# Patient Record
Sex: Female | Born: 1965 | Race: White | Hispanic: No | Marital: Married | Smoking: Never smoker
Health system: Southern US, Academic
[De-identification: ages and names within clinical notes are randomized; demographics above are authoritative.]

## PROBLEM LIST (undated history)

## (undated) DIAGNOSIS — T8859XA Other complications of anesthesia, initial encounter: Secondary | ICD-10-CM

## (undated) DIAGNOSIS — L940 Localized scleroderma [morphea]: Secondary | ICD-10-CM

## (undated) DIAGNOSIS — K449 Diaphragmatic hernia without obstruction or gangrene: Secondary | ICD-10-CM

## (undated) DIAGNOSIS — E785 Hyperlipidemia, unspecified: Secondary | ICD-10-CM

## (undated) DIAGNOSIS — I10 Essential (primary) hypertension: Secondary | ICD-10-CM

## (undated) DIAGNOSIS — E039 Hypothyroidism, unspecified: Secondary | ICD-10-CM

## (undated) DIAGNOSIS — M069 Rheumatoid arthritis, unspecified: Secondary | ICD-10-CM

## (undated) DIAGNOSIS — M329 Systemic lupus erythematosus, unspecified: Secondary | ICD-10-CM

## (undated) DIAGNOSIS — Z8679 Personal history of other diseases of the circulatory system: Secondary | ICD-10-CM

## (undated) HISTORY — DX: Hyperlipidemia, unspecified: E78.5

## (undated) HISTORY — DX: Other complications of anesthesia, initial encounter: T88.59XA

## (undated) HISTORY — DX: Personal history of other diseases of the circulatory system: Z86.79

## (undated) HISTORY — PX: HX APPENDECTOMY: SHX54

## (undated) HISTORY — PX: HX COLONOSCOPY: 2100001147

## (undated) HISTORY — PX: LAPAROSCOPIC CHOLECYSTECTOMY: SUR755

## (undated) HISTORY — PX: HX TONSILLECTOMY: SHX27

## (undated) HISTORY — DX: Hypothyroidism, unspecified: E03.9

## (undated) HISTORY — DX: Localized scleroderma (morphea): L94.0

## (undated) HISTORY — DX: Rheumatoid arthritis, unspecified: M06.9

## (undated) HISTORY — DX: Diaphragmatic hernia without obstruction or gangrene: K44.9

## (undated) HISTORY — DX: Systemic lupus erythematosus, unspecified: M32.9

## (undated) HISTORY — DX: Essential (primary) hypertension: I10

---

## 1993-02-09 ENCOUNTER — Other Ambulatory Visit (HOSPITAL_COMMUNITY): Payer: Self-pay

## 2018-11-29 IMAGING — CT CT HEAD/BRAIN W/O & W/ DYE
3 series · 17 of 30 positions shown, 19 images · non-contrast
Comparison: None available.

EXAM:  CT HEAD/BRAIN W/O & W/ DYE
INDICATION: Dizziness.
TECHNIQUE: Axial CT imaging was performed from skull base to the vertex without and with 75 mL of Optiray 300. Exam was performed using 1 or more of the following dose reduction techniques: Automated exposure control, adjustment of the mA and/or kV according to patient size, or the use of iterative reconstruction technique.

[Series 6: ax (hospital) · axial · 0.49mm/px · z∈[+1419,+1539]mm · 8 of 104 slices shown, 10 images]
[im 12/104  brain]
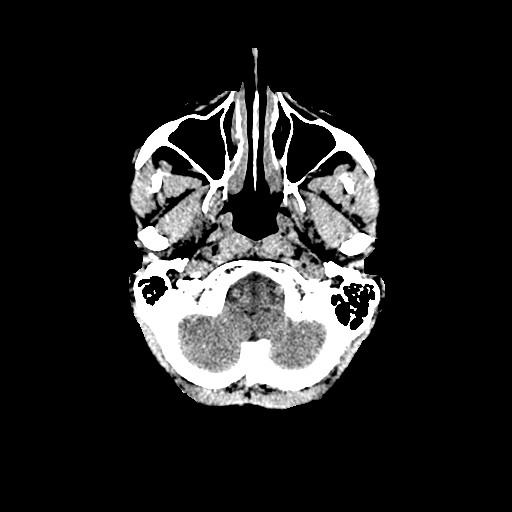
[im 12/104  bone]
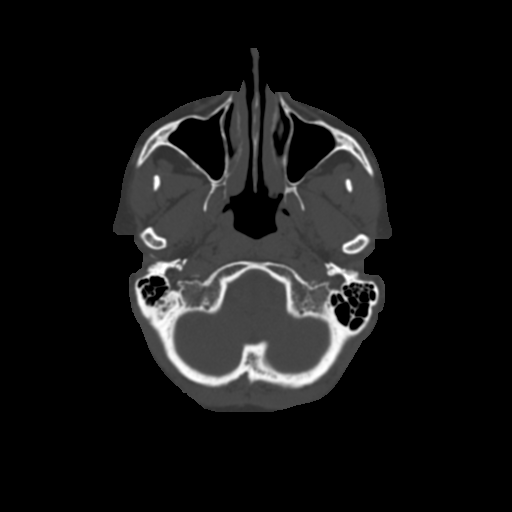
[im 23/104  brain]
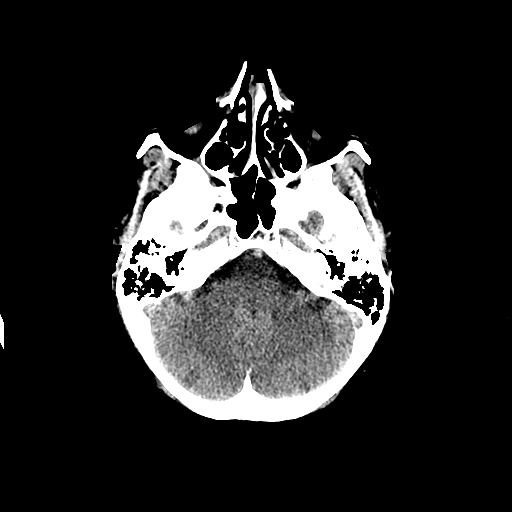
[im 35/104  brain]
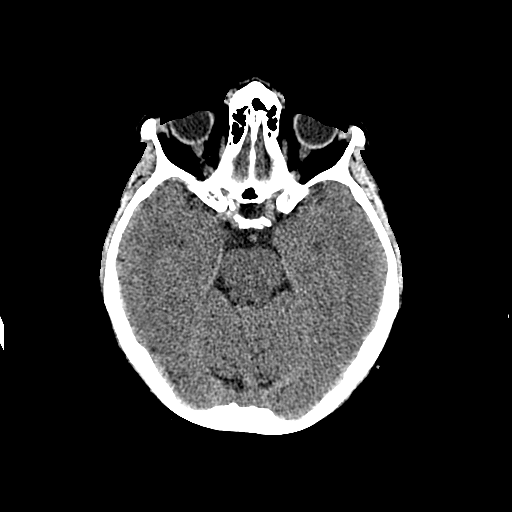
[im 46/104  brain]
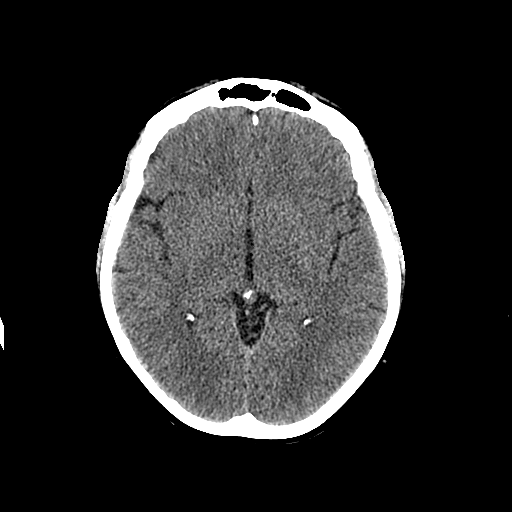
[im 58/104  brain]
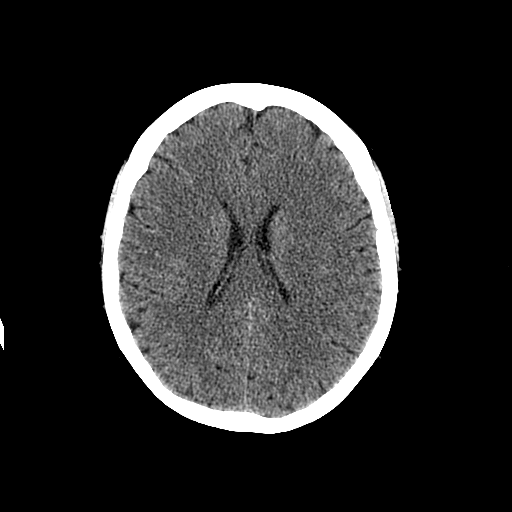
[im 58/104  bone]
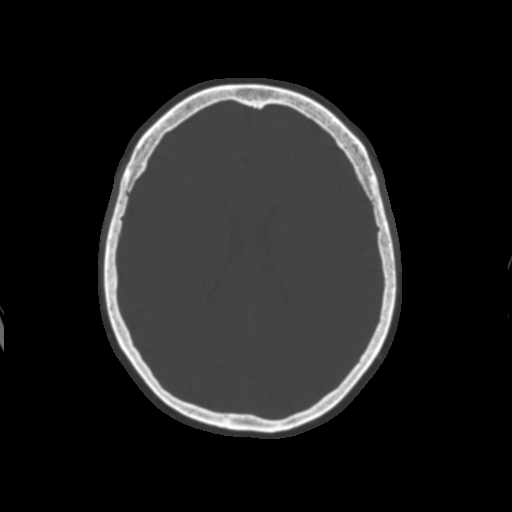
[im 69/104  brain]
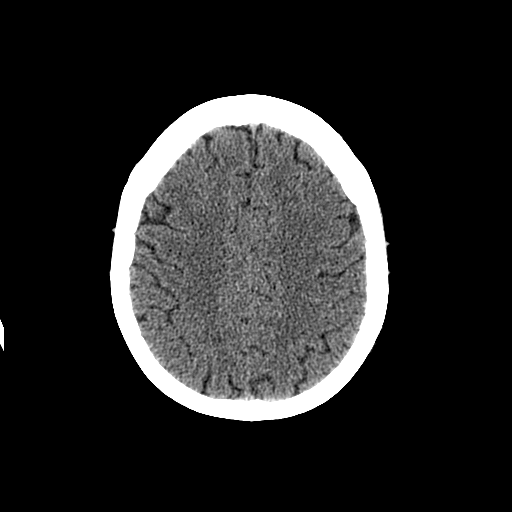
[im 81/104  brain]
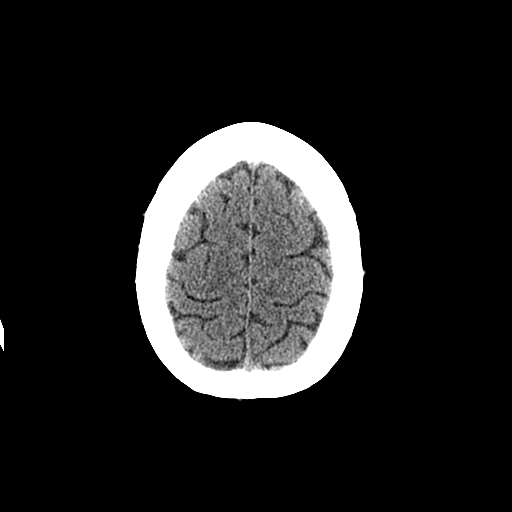
[im 92/104  brain]
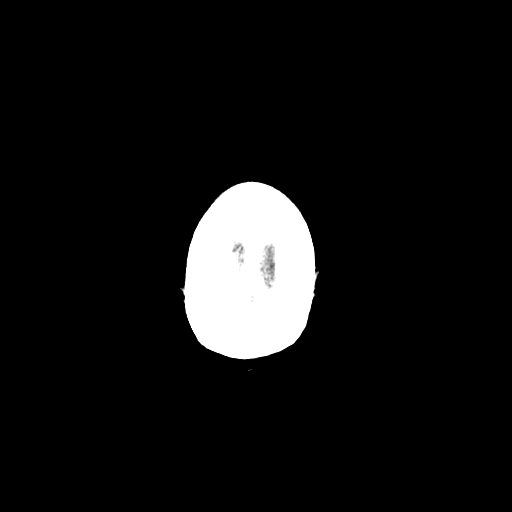

[bone wc · axial · 0.49mm/px · 1 of 104 slices shown]
[im 12/104  bone]
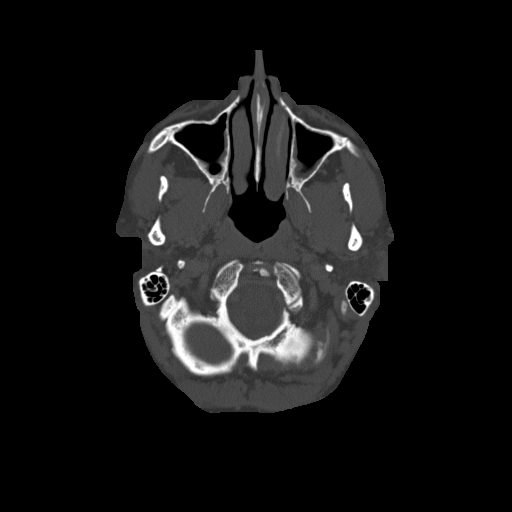

[ax wc · axial · 0.49mm/px · z∈[+1419,+1539]mm · 8 of 104 slices shown]
[im 12/104  brain]
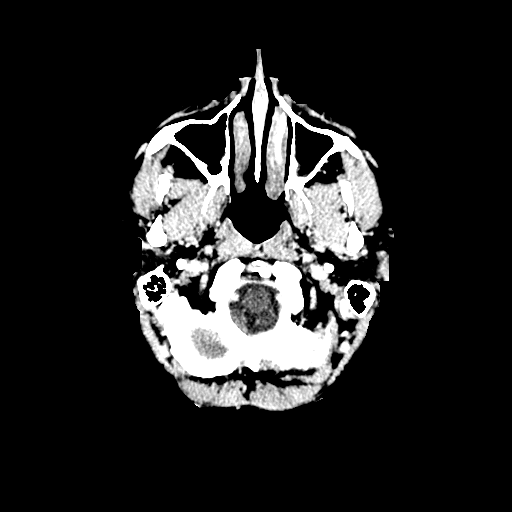
[im 23/104  brain]
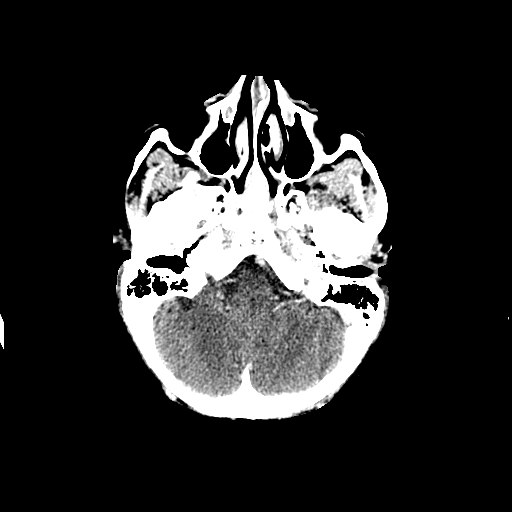
[im 35/104  brain]
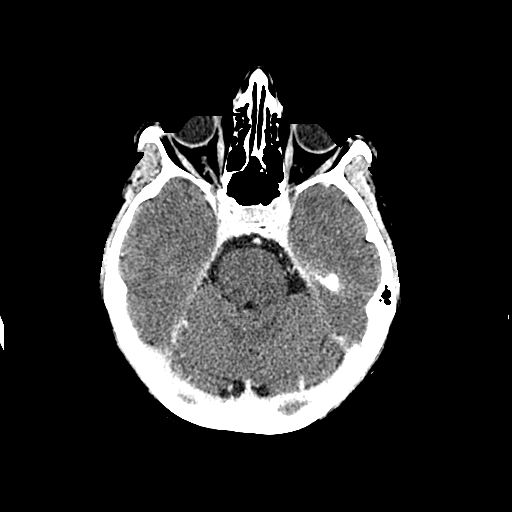
[im 46/104  brain]
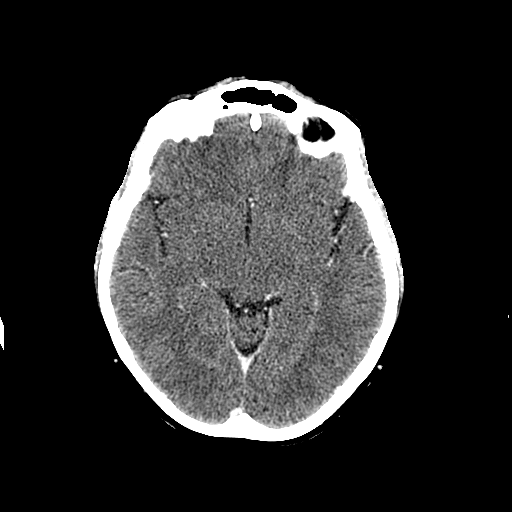
[im 58/104  brain]
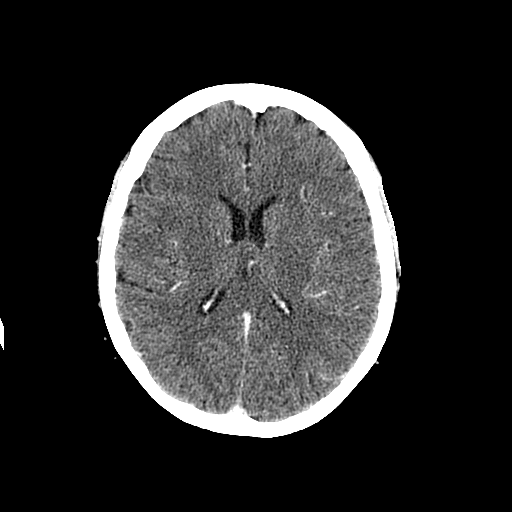
[im 69/104  brain]
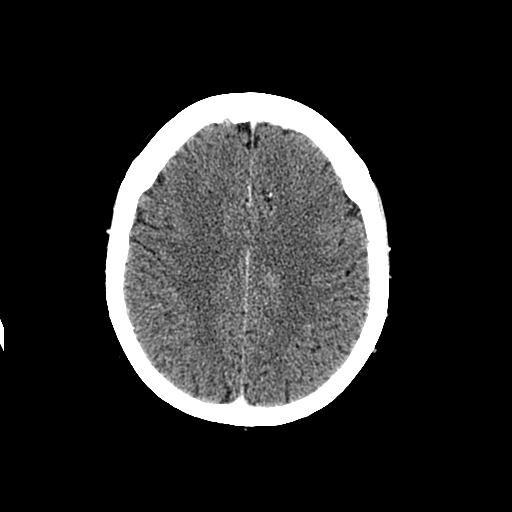
[im 81/104  brain]
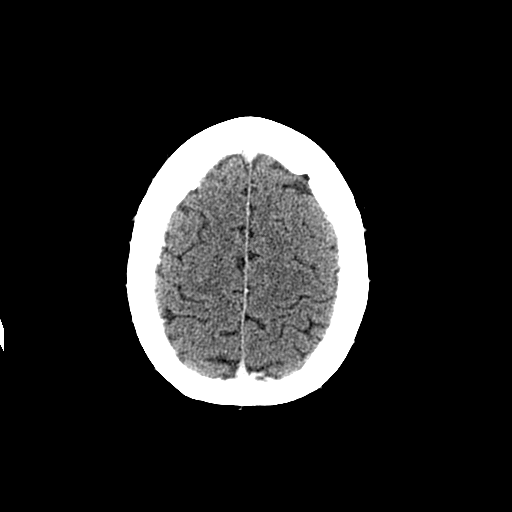
[im 92/104  brain]
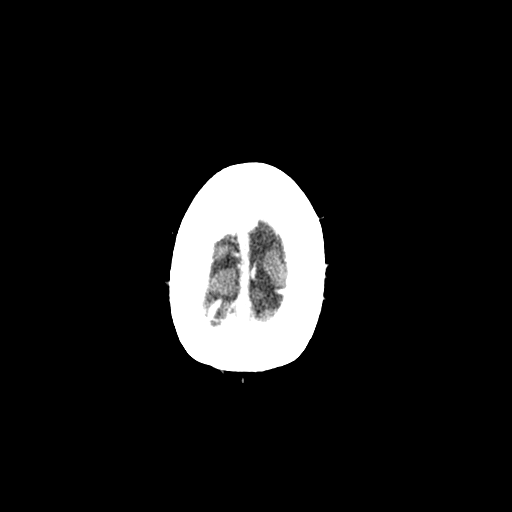

[17 of 30 positions shown; findings below may reference images not displayed]

FINDINGS: Ventricular and sulcal size is normal for the patient's age. There are no focal areas of abnormal attenuation within the brain parenchyma. There is no mass effect, midline shift or intracranial hemorrhage. Following intravenous contrast administration, there is no abnormal enhancement. No extra-axial fluid collections are identified. Visualized paranasal sinuses, mastoid air cells and orbital contents are also unremarkable.
IMPRESSION: Unremarkable exam.

## 2020-04-02 ENCOUNTER — Other Ambulatory Visit (HOSPITAL_COMMUNITY): Payer: Self-pay

## 2020-04-02 LAB — EXTERNAL COVID-19 MOLECULAR RESULT: External 2019-n-CoV/SARS-CoV-2: POSITIVE — AB

## 2020-06-12 ENCOUNTER — Other Ambulatory Visit (HOSPITAL_COMMUNITY): Payer: Self-pay

## 2020-06-12 LAB — EXTERNAL COVID-19 MOLECULAR RESULT: External 2019-n-CoV/SARS-CoV-2: POSITIVE — AB

## 2021-03-07 IMAGING — MR MRI BRAIN W/O CONTRAST
10 series · 42 of 48 positions shown · IV contrast (gadolinium)
Comparison: CT head dated 11/29/2018.

﻿EXAM:  MRI BRAIN W/O CONTRAST
INDICATION: Bilateral upper extremity tremor.
TECHNIQUE: Multiplanar, multisequential MRI of the brain was performed without gadolinium contrast.

[Series 5: s-map · sagittal · 9.4mm · 4.69mm/px · 8 of 64 slices shown]
[im 1/64]
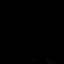
[im 8/64]
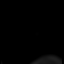
[im 22/64]
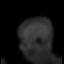
[im 29/64]
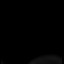
[im 36/64]
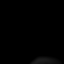
[im 43/64]
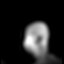
[im 57/64]
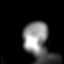
[im 64/64]
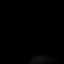

[Series 6: DWI · axial · 5.0mm · 1.35mm/px · z∈[-60,+66]mm · 9 of 88 slices shown (1 of 3)]
[im 1/88]
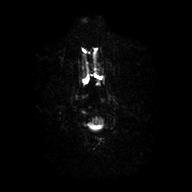
[im 15/88]
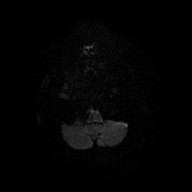
[im 30/88]
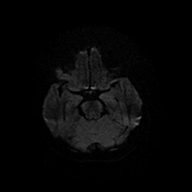
[im 37/88]
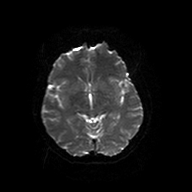
[im 44/88]
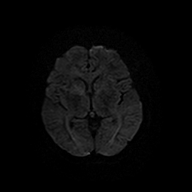
[im 51/88]
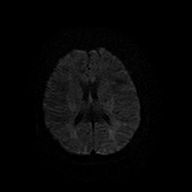
[im 59/88]
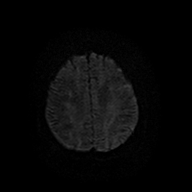
[im 73/88]
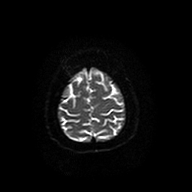
[im 88/88]
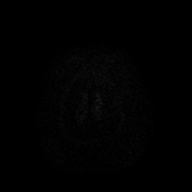

[Series 7: DWI · axial · 5.0mm · 1.35mm/px · z∈[-60,+66]mm · 3 of 22 slices shown (2 of 3)]
[im 1/22]
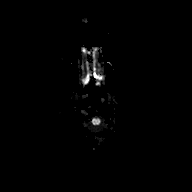
[im 11/22]
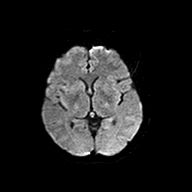
[im 22/22]
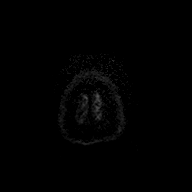

[Series 8: DWI · axial · 5.0mm · 1.35mm/px · z∈[-60,+66]mm · 3 of 21 slices shown (3 of 3)]
[im 1/21]
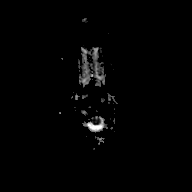
[im 11/21]
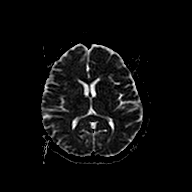
[im 21/21]
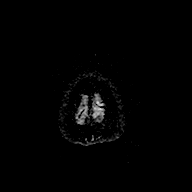

[Series 9: T2 · coronal · 6.0mm · 0.43mm/px · 3 of 24 slices shown (1 of 2)]
[im 1/24]
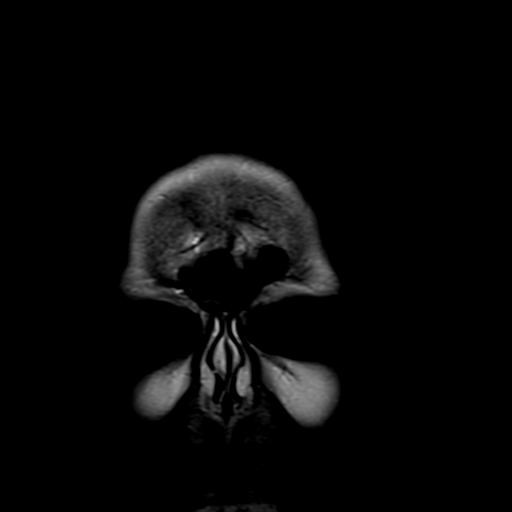
[im 12/24]
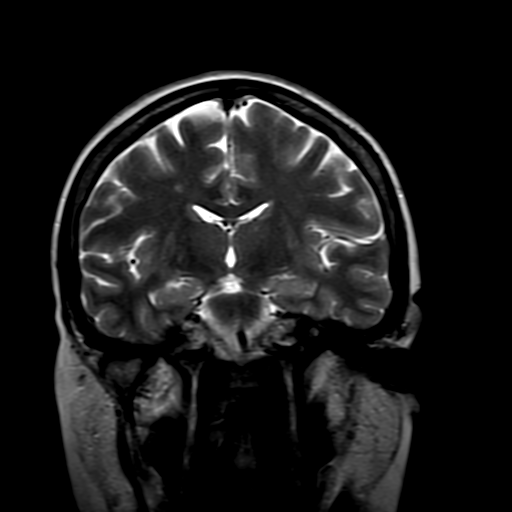
[im 24/24]
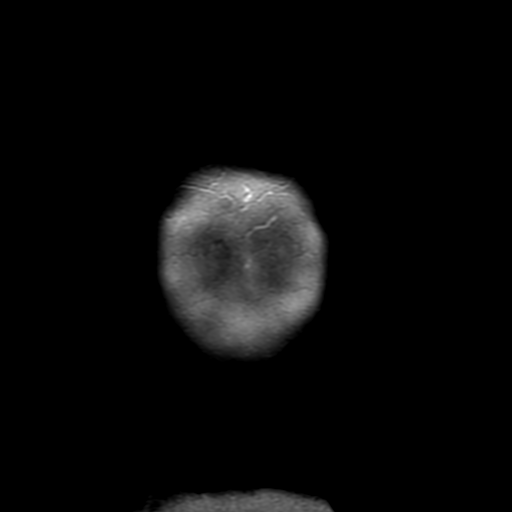

[Series 10: FLAIR · sagittal · 4.0mm · 0.75mm/px · 4 of 26 slices shown (1 of 2)]
[im 1/26]
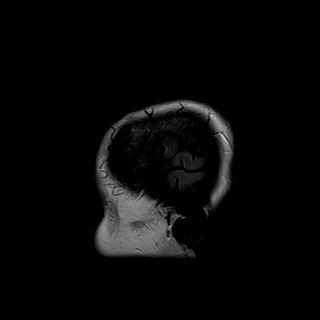
[im 9/26]
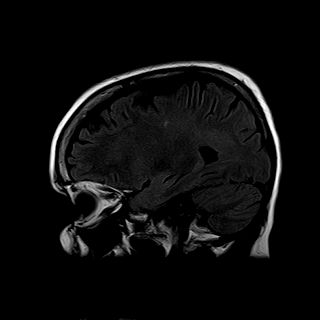
[im 17/26]
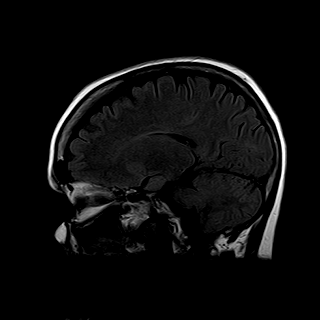
[im 26/26]
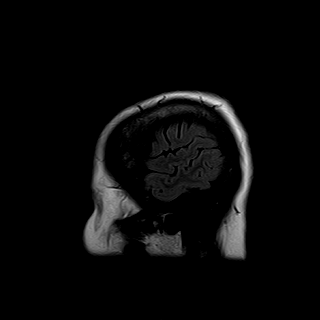

[Series 11: T2-star · axial · 5.0mm · 0.43mm/px · z∈[-66,+78]mm · 3 of 25 slices shown]
[im 1/25]
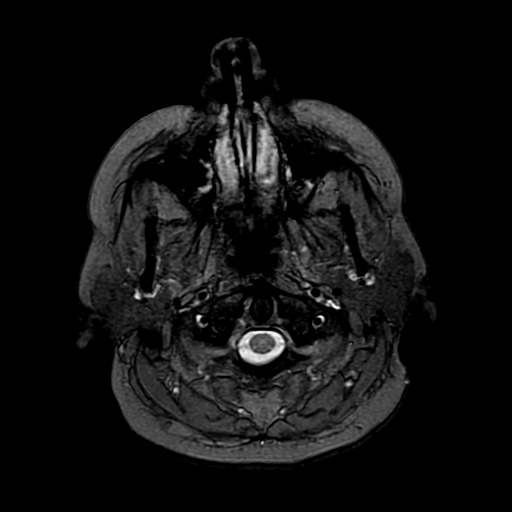
[im 13/25]
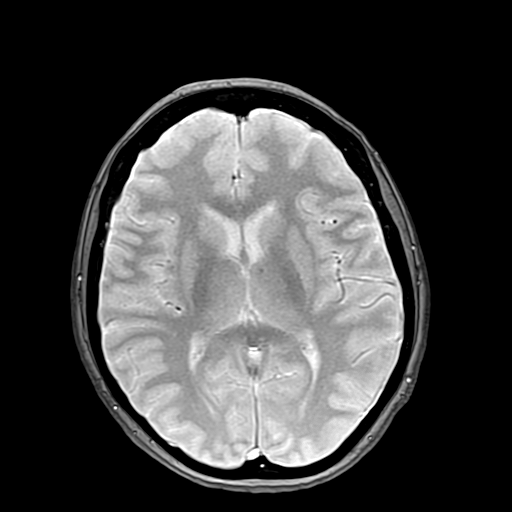
[im 25/25]
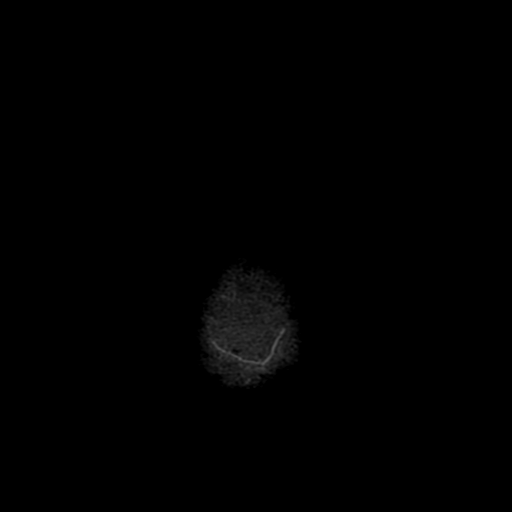

[Series 12: T1 · axial · 5.0mm · 0.43mm/px · z∈[-66,+78]mm · 3 of 25 slices shown]
[im 1/25]
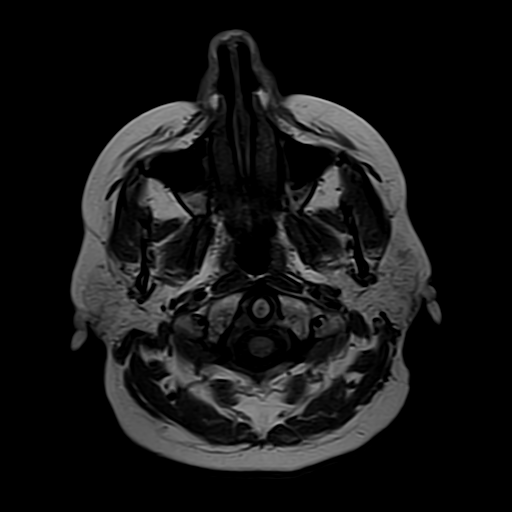
[im 13/25]
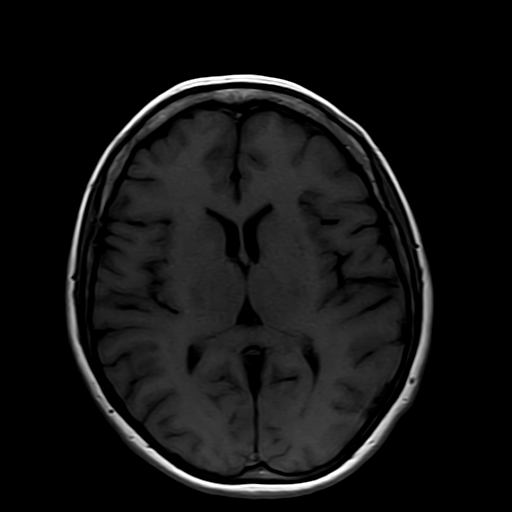
[im 25/25]
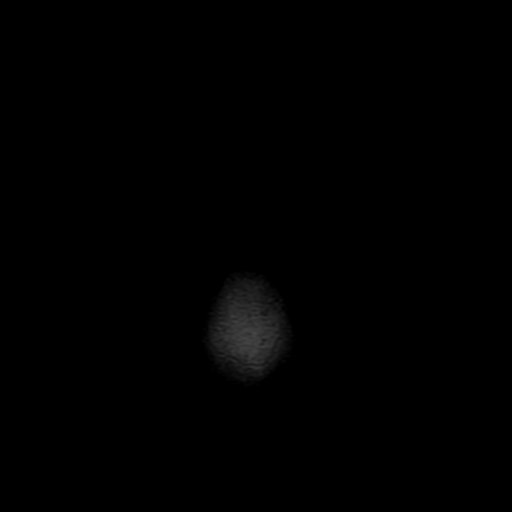

[Series 13: FLAIR · axial · 5.0mm · 0.43mm/px · z∈[-66,+78]mm · 3 of 25 slices shown (2 of 2)]
[im 1/25]
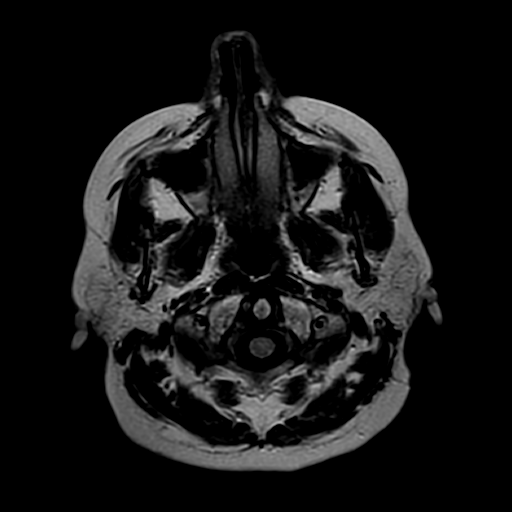
[im 13/25]
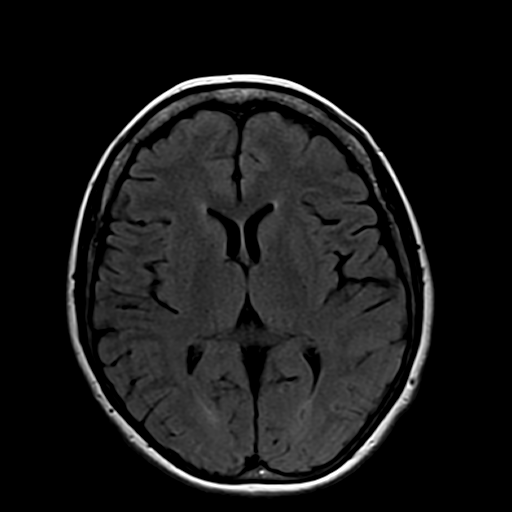
[im 25/25]
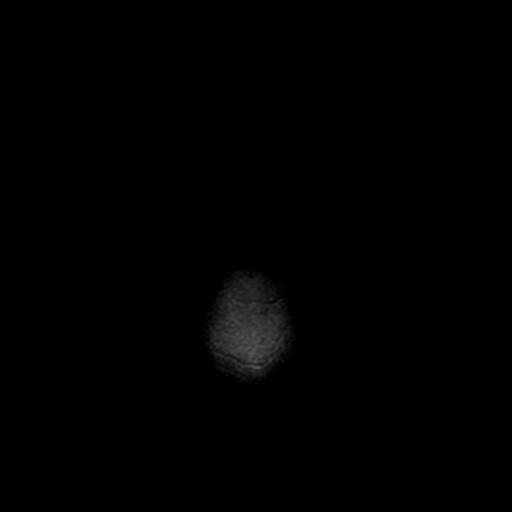

[Series 14: T2 · axial · 5.0mm · 0.43mm/px · z∈[-66,+78]mm · 3 of 25 slices shown (2 of 2)]
[im 1/25]
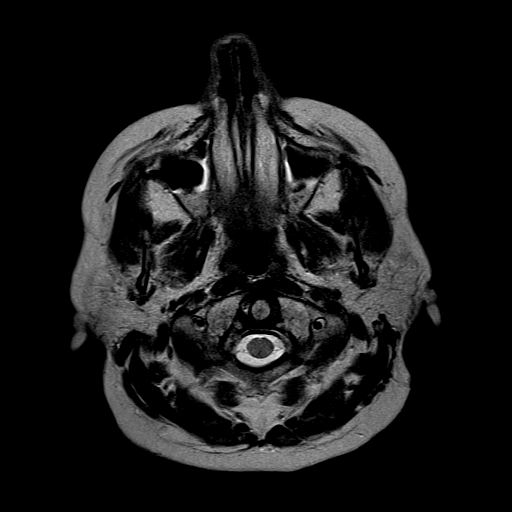
[im 13/25]
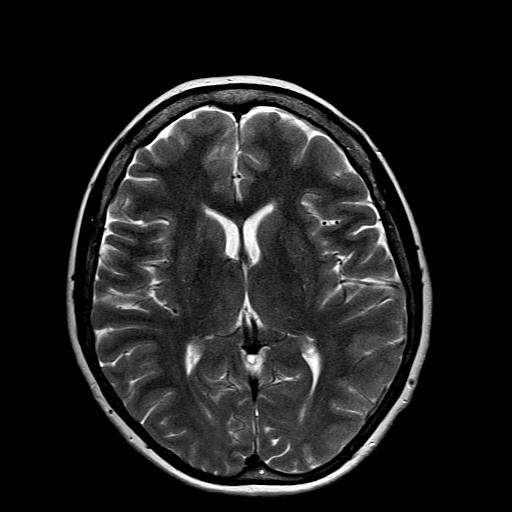
[im 25/25]
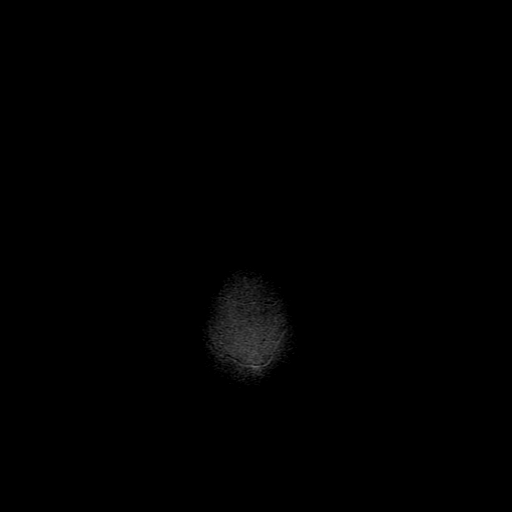

[42 of 48 positions shown; findings below may reference images not displayed]

FINDINGS: Ventricular and sulcal size is normal for the patient's age. There are minimal chronic small vessel ischemic changes. There is no mass effect, midline shift or intracranial hemorrhage. There is no evidence of acute infarction or prior microhemorrhages. Skull base flow voids and basal cisterns are patent. Sagittal survey of midline structures is unremarkable. There are no extra-axial fluid collections. Visualized paranasal sinuses, mastoid air cells and orbital contents are unremarkable.
IMPRESSION: Unremarkable exam.

## 2021-03-27 IMAGING — CT CT HEAD W/O & W/ CONTRAST
2 of 3 series · 16 of 30 positions shown, 19 images · IV contrast (omnipaque)
Comparison: Brain MRI dated 03/07/2021.

﻿EXAM:  CT HEAD W/O & W/ CONTRAST
INDICATION: Cervical adenopathy.
TECHNIQUE: Axial CT imaging was performed from skull base to the vertex without and with 75 mL of Omnipaque 350. Exam was performed using 1 or more of the following dose reduction techniques: Automated exposure control, adjustment of the mA and/or kV according to patient size, or the use of iterative reconstruction technique.

[ax pre · axial · non-contrast · 0.41mm/px · z∈[-429,-299]mm · 9 of 83 slices shown, 12 images]
[im 9/83  brain]
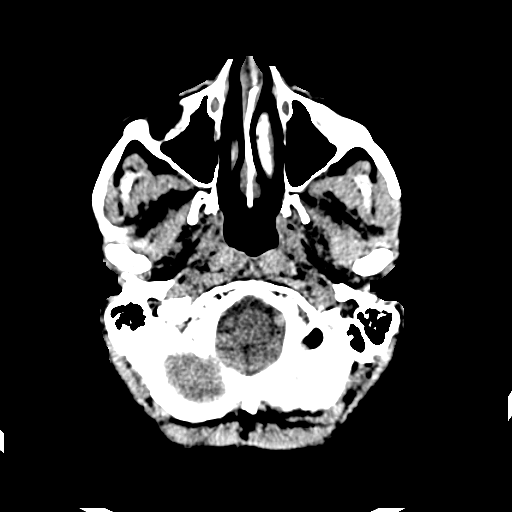
[im 9/83  bone]
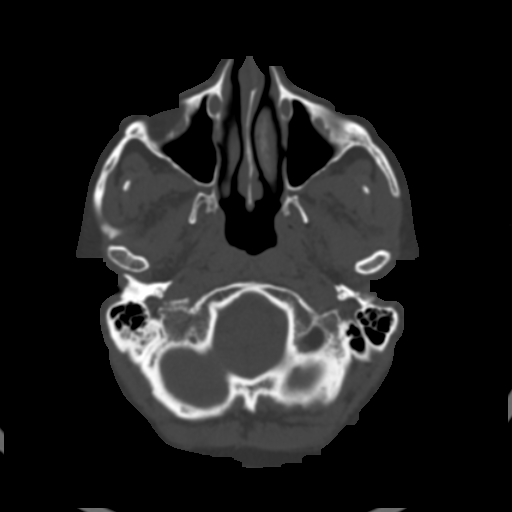
[im 17/83  brain]
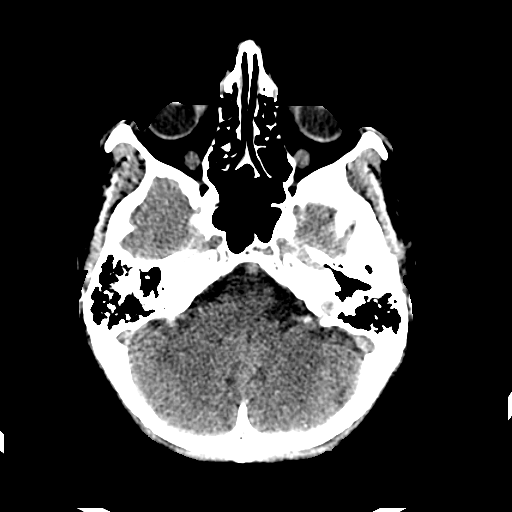
[im 25/83  brain]
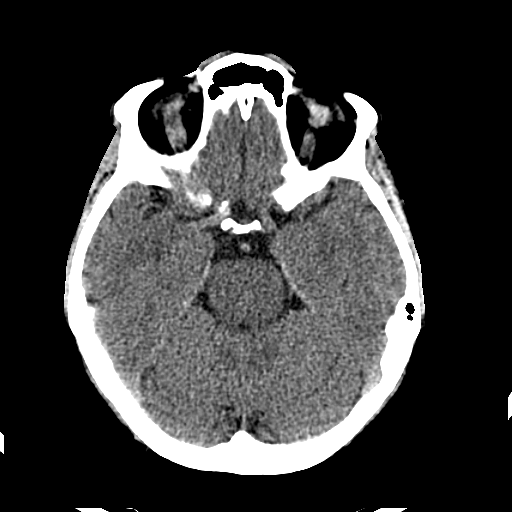
[im 33/83  brain]
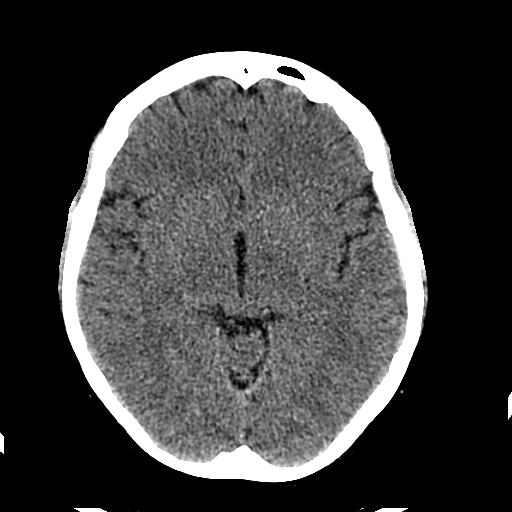
[im 42/83  brain]
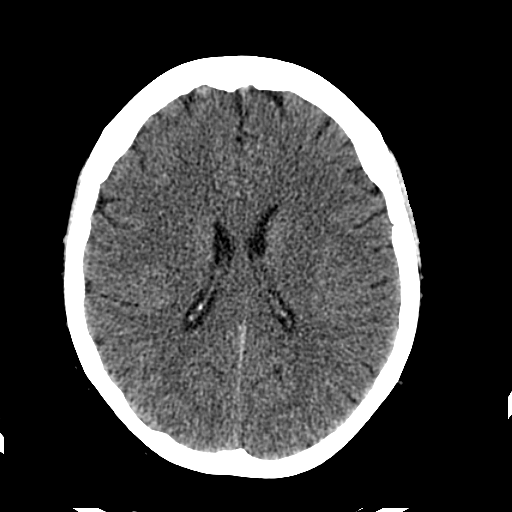
[im 42/83  bone]
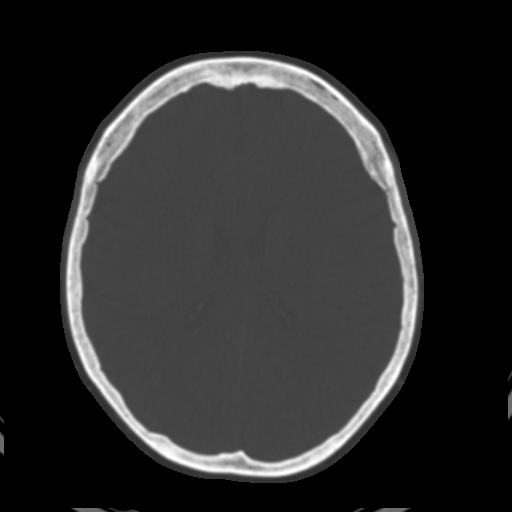
[im 50/83  brain]
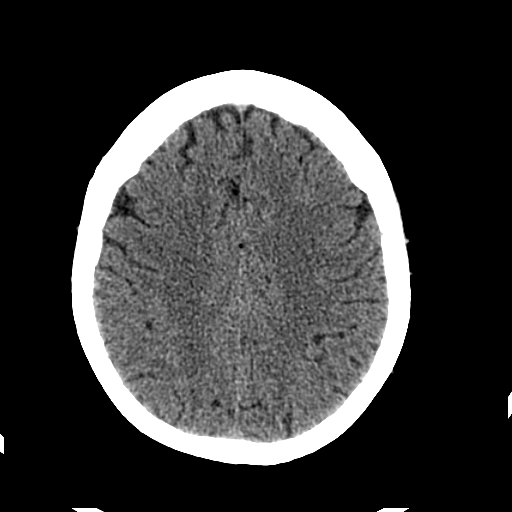
[im 58/83  brain]
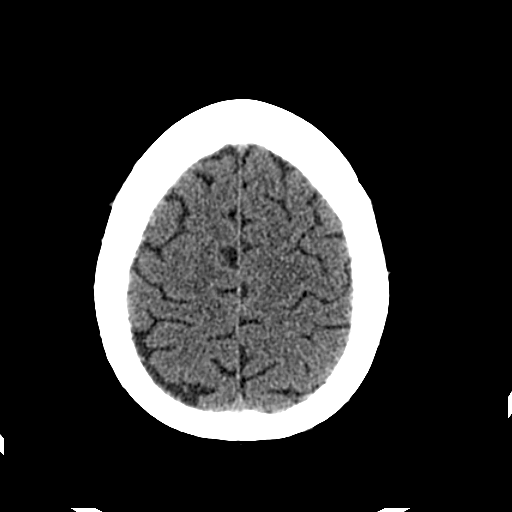
[im 66/83  brain]
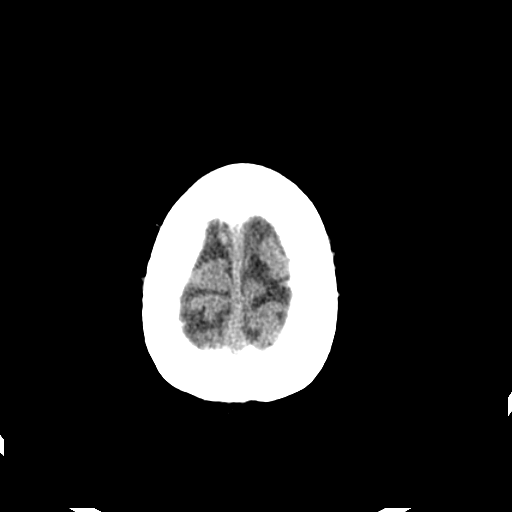
[im 74/83  brain]
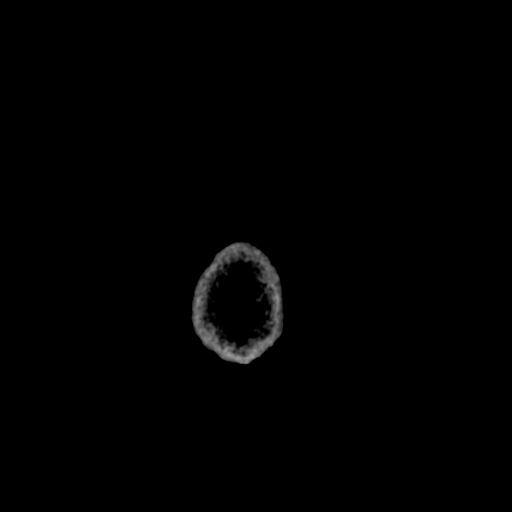
[im 74/83  bone]
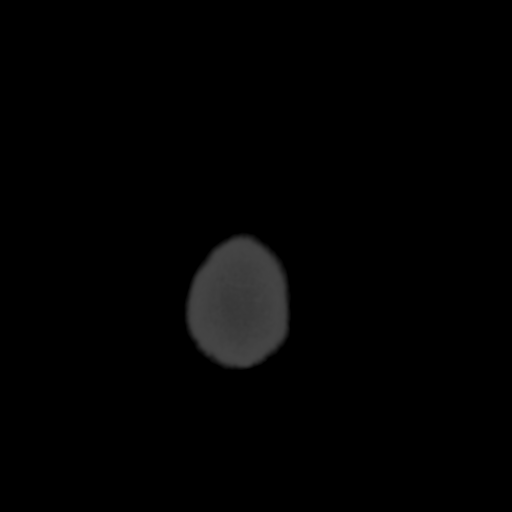

[bone · axial · 0.37mm/px · z∈[-423,-315]mm · 7 of 73 slices shown]
[im 10/73  bone]
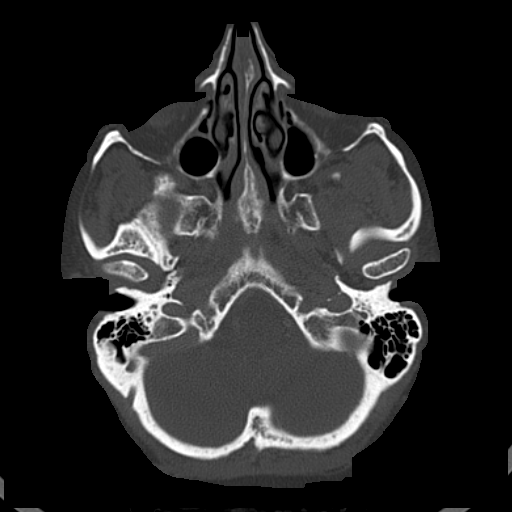
[im 19/73  bone]
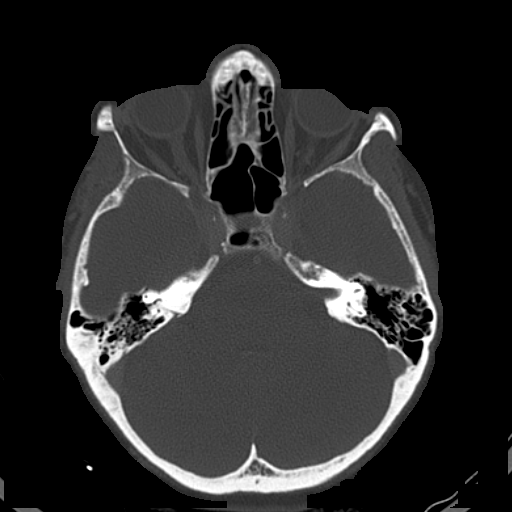
[im 28/73  bone]
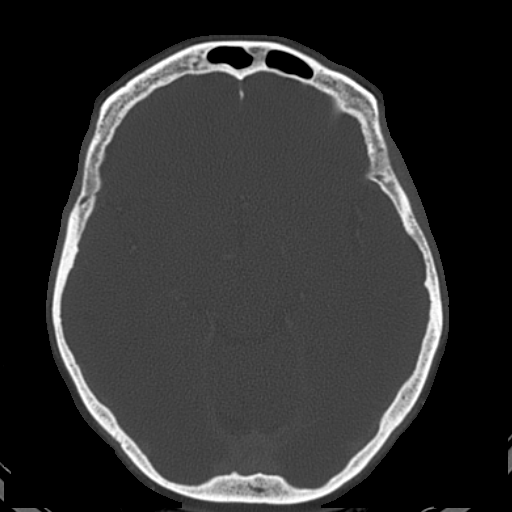
[im 37/73  bone]
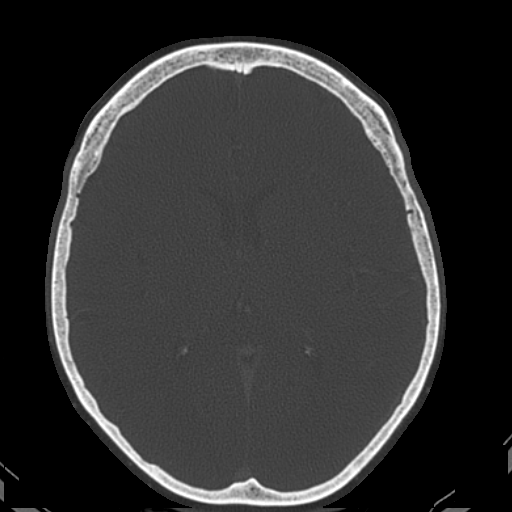
[im 46/73  bone]
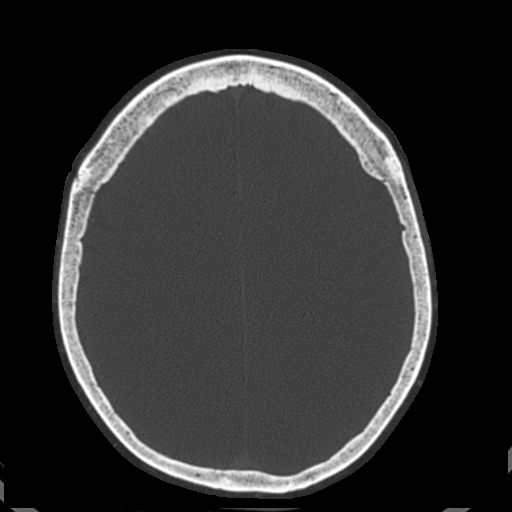
[im 55/73  bone]
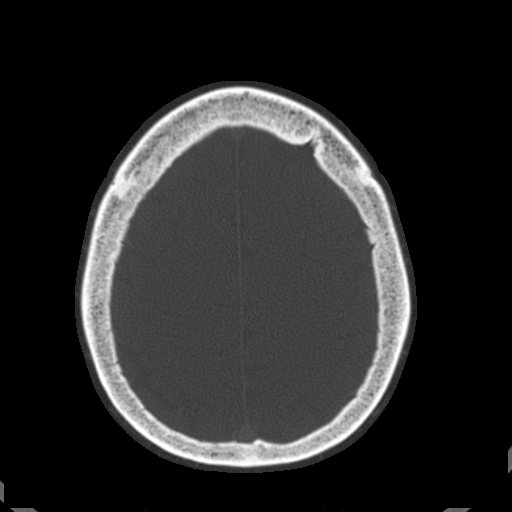
[im 64/73  bone]
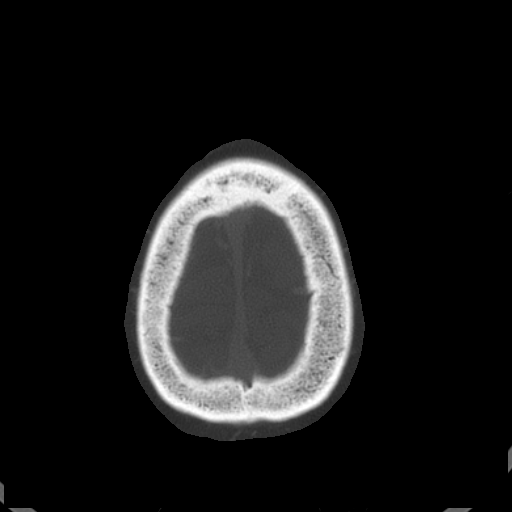

[16 of 30 positions shown; findings below may reference images not displayed]

FINDINGS: Ventricular and sulcal size is normal for the patient's age. There are no focal areas of abnormal attenuation within the brain parenchyma. There is no mass effect, midline shift or intracranial hemorrhage. Following intravenous contrast administration, there is no abnormal enhancement. No extra-axial fluid collections are identified. Visualized paranasal sinuses, mastoid air cells and orbital contents are also unremarkable.
IMPRESSION: Unremarkable exam.

## 2021-03-27 IMAGING — CT CT SOFT TISSUE NECK WITH CONTRAST
3 series · 16 of 33 positions shown, 19 images · IV contrast (omnipaque)
Comparison: None available.

﻿EXAM:  CT SOFT TISSUE NECK WITH CONTRAST
INDICATION: Cervical adenopathy.
TECHNIQUE: Axial CT imaging of the neck was performed following intravenous administration of 75 mL of Omnipaque 350. Images were reviewed in multiple windows and projections. Exam was performed using 1 or more of the following dose reduction techniques: Automated exposure control, adjustment of the mA and/or kV according to patient size, or the use of iterative reconstruction technique.

[ax post · axial · 0.39mm/px · z∈[-594,-430]mm · 8 of 49 slices shown, 10 images]
[im 4/49  soft-tissue]
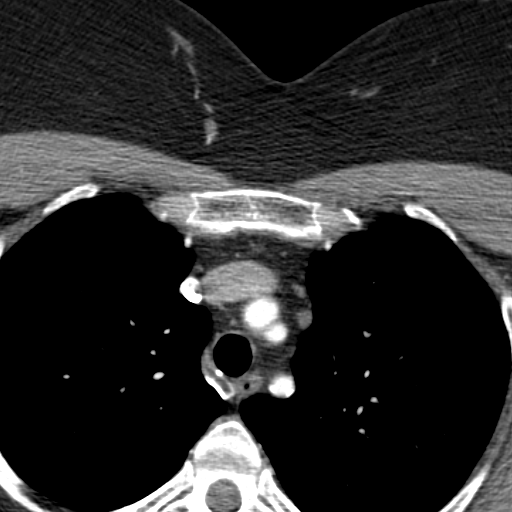
[im 4/49  bone]
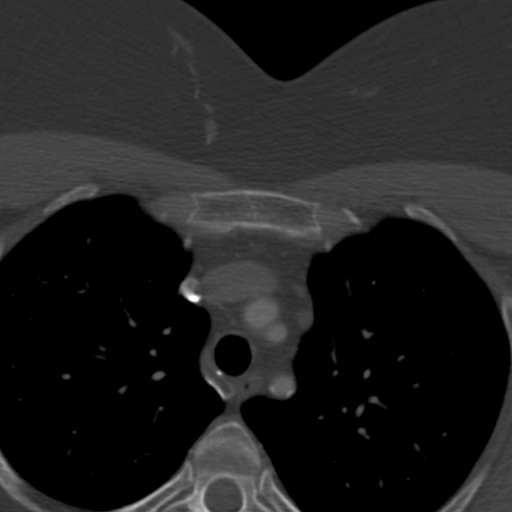
[im 12/49  bone]
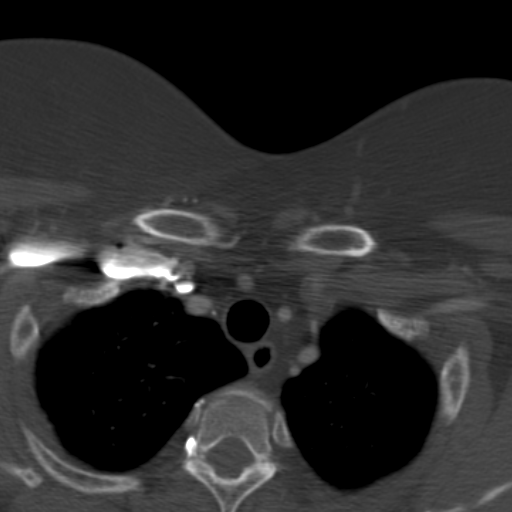
[im 15/49  bone]
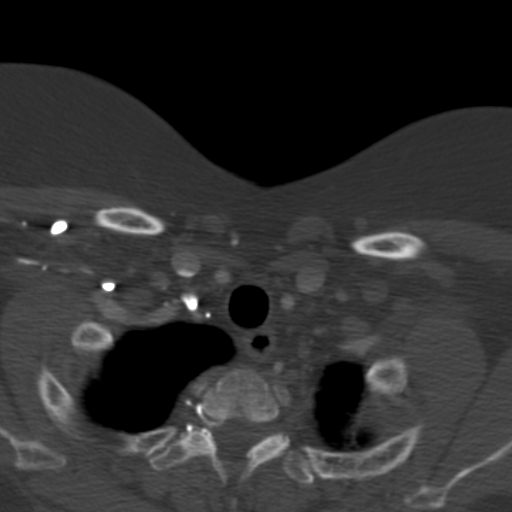
[im 23/49  bone]
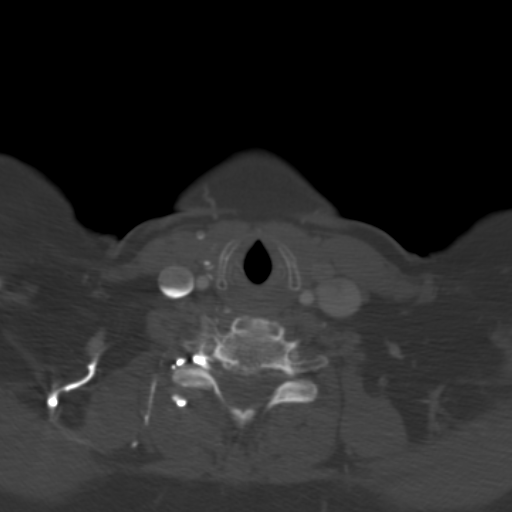
[im 26/49  soft-tissue]
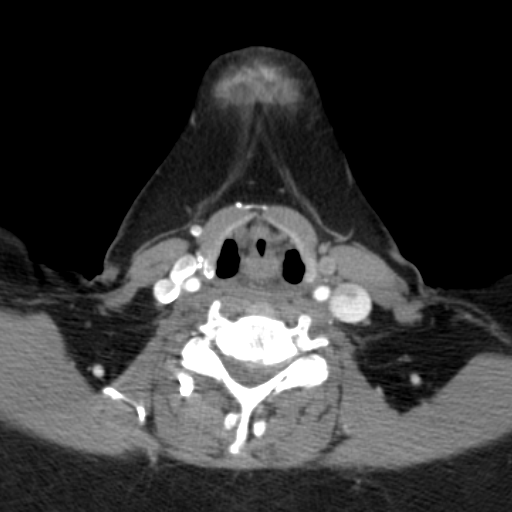
[im 26/49  bone]
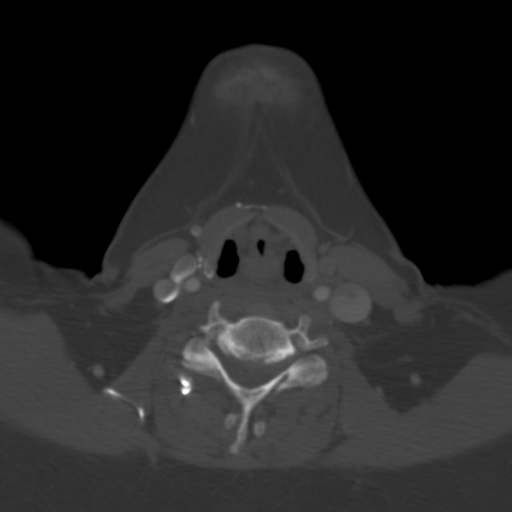
[im 34/49  bone]
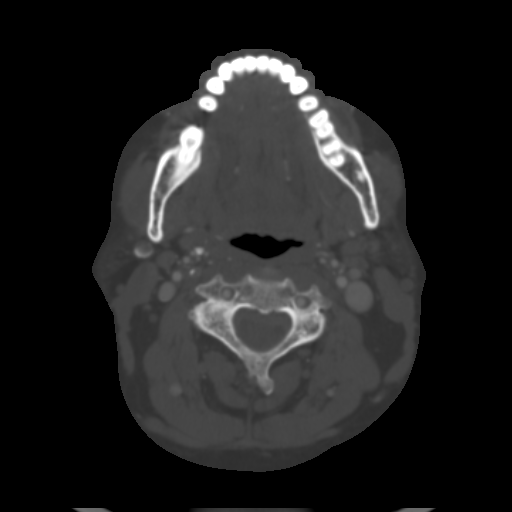
[im 37/49  bone]
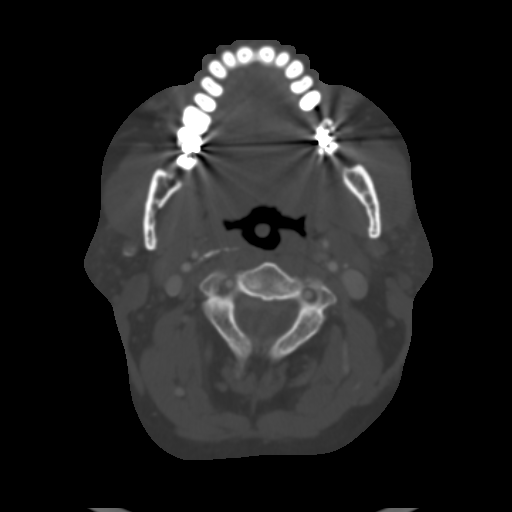
[im 45/49  bone]
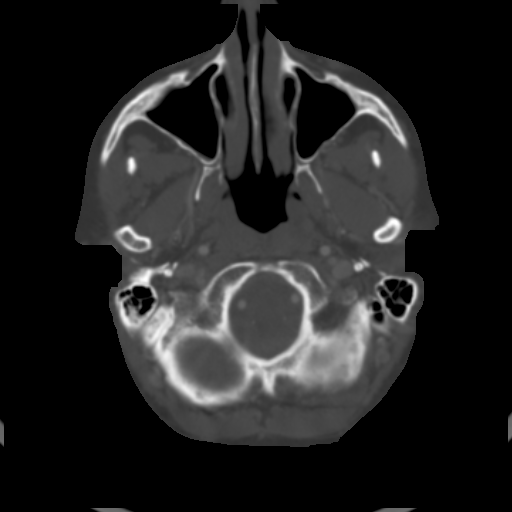

[cor · coronal · 0.45mm/px · 3 of 53 slices shown]
[im 11/53  bone]
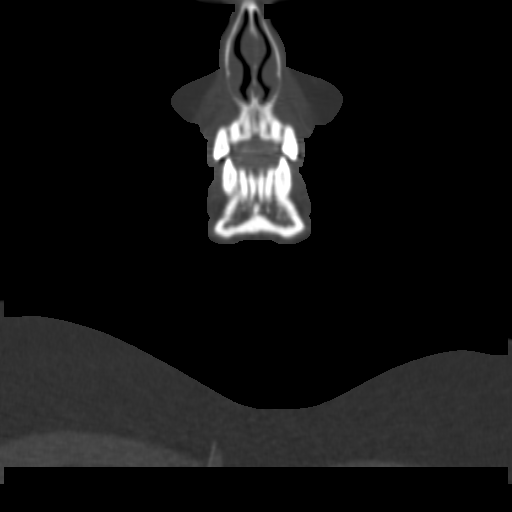
[im 21/53  bone]
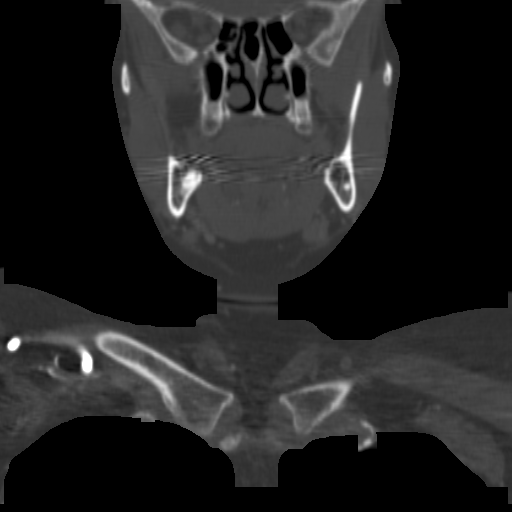
[im 32/53  bone]
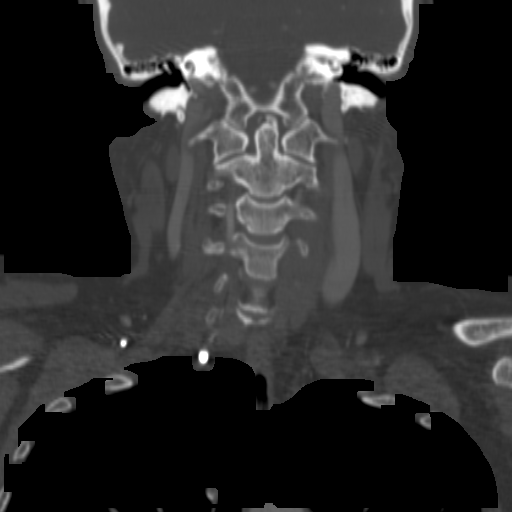

[sag · sagittal · 0.53mm/px · 5 of 58 slices shown, 6 images]
[im 20/58  bone]
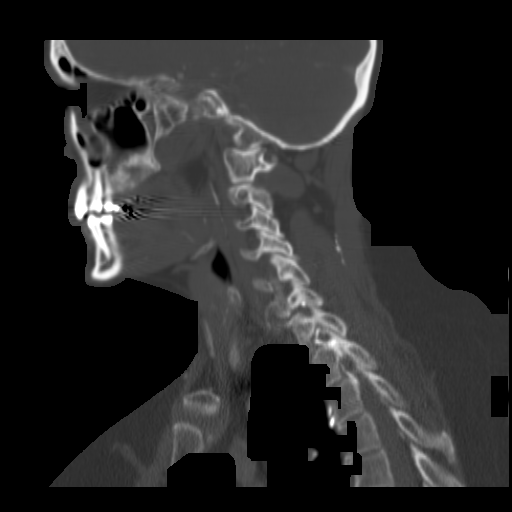
[im 24/58  bone]
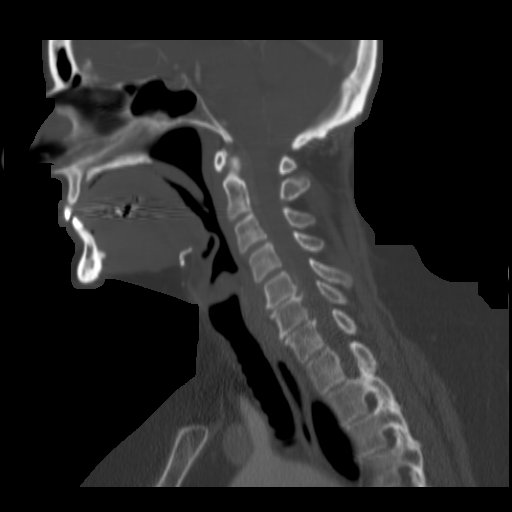
[im 29/58  soft-tissue]
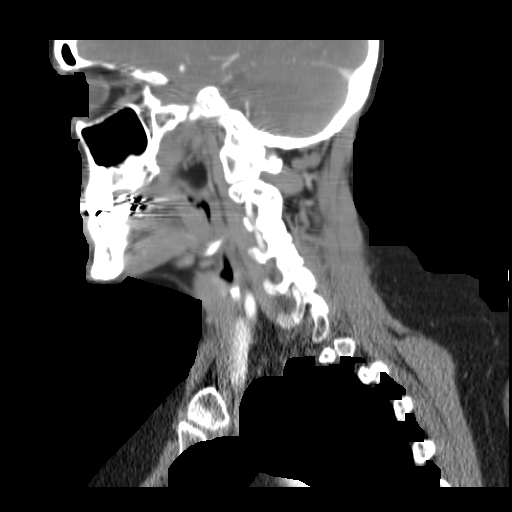
[im 29/58  bone]
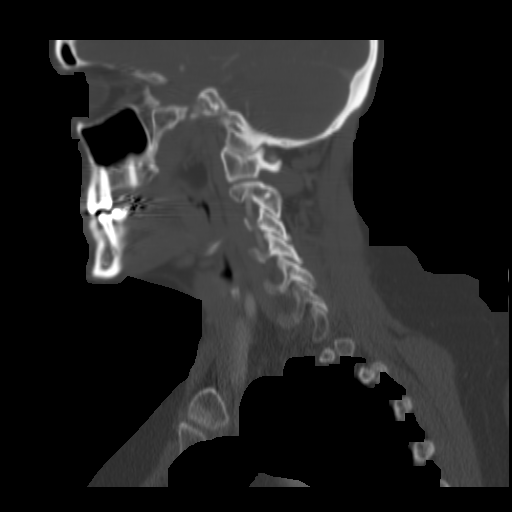
[im 34/58  bone]
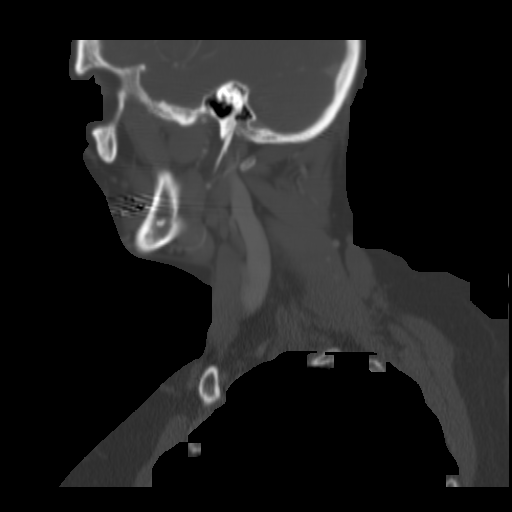
[im 39/58  bone]
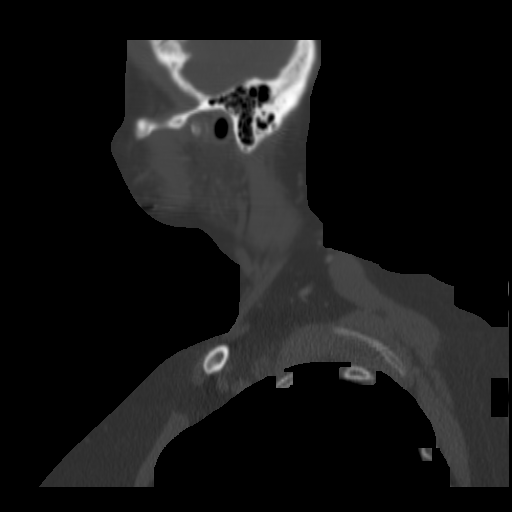

[16 of 33 positions shown; findings below may reference images not displayed]

FINDINGS: The borders of the oro- and nasopharynx are well-defined and fairly symmetric without suspicious soft tissue mass. The parotid, submandibular and thyroid glands are unremarkable. There is no evidence of adenopathy. The parapharyngeal spaces and other fascial compartments of the neck are also well maintained. There is no suspicious soft tissue infiltration. No vascular abnormality or abnormal enhancement is noted following intravenous contrast administration. Lung apices are clear. Advanced multilevel arthritic changes seen within the cervical spine.
IMPRESSION: Unremarkable exam.

## 2021-03-29 ENCOUNTER — Ambulatory Visit (HOSPITAL_COMMUNITY): Payer: Self-pay

## 2021-03-29 NOTE — Telephone Encounter (Addendum)
-----   Message from Rupert Stacks, RN sent at 03/29/2021 12:01 PM EDT -----  Regarding: FW: np-cardiology-callback  ----- Message from Martyn Ehrich sent at 03/29/2021 11:49 AM EDT -----  Np-cardiology    Dr. Vinie Sill office called in to schedule the patient for Abnormal findings on her echo    Records are being sent from [facility]    Pt can be reached at [phone]      Echo results received. Referral e-mail sent.Jaci Standard, RN  03/29/2021, 14:48

## 2021-04-01 ENCOUNTER — Other Ambulatory Visit (HOSPITAL_COMMUNITY): Payer: Self-pay

## 2021-04-01 DIAGNOSIS — R931 Abnormal findings on diagnostic imaging of heart and coronary circulation: Secondary | ICD-10-CM

## 2021-04-04 ENCOUNTER — Encounter (HOSPITAL_COMMUNITY): Payer: Self-pay | Admitting: Internal Medicine

## 2021-04-04 ENCOUNTER — Other Ambulatory Visit: Payer: Self-pay

## 2021-04-04 ENCOUNTER — Ambulatory Visit: Payer: BC Managed Care – PPO | Admitting: Internal Medicine

## 2021-04-04 VITALS — BP 130/83 | HR 79 | Temp 97.8°F | Ht 65.0 in | Wt 203.5 lb

## 2021-04-04 DIAGNOSIS — Z7689 Persons encountering health services in other specified circumstances: Secondary | ICD-10-CM | POA: Insufficient documentation

## 2021-04-04 DIAGNOSIS — I5032 Chronic diastolic (congestive) heart failure: Secondary | ICD-10-CM | POA: Insufficient documentation

## 2021-04-04 DIAGNOSIS — R9431 Abnormal electrocardiogram [ECG] [EKG]: Secondary | ICD-10-CM

## 2021-04-04 DIAGNOSIS — R931 Abnormal findings on diagnostic imaging of heart and coronary circulation: Secondary | ICD-10-CM

## 2021-04-04 NOTE — Progress Notes (Signed)
Cardiology [place], [facility]  [place] [address]  [phone]    Cardiology  New Patient Clinic Note    Name: Laura Burton   DOB: May 21, 1966  [55 y.o. female]   MRN: [id number]       Visit Date: 04/04/2021   Referring: Rogelia Mire, FNP-BC  [place],   [id number]   PCP: No Pcp       Reason For Visit:  Initial office visit with cardiology.  Chief Complaint: Establish Care and Abnormal Echo    Information provided by: patient      History of Present Illness  Laura Burton is a 55 yo F with pmh of HTN on HCTZ-Lisinopril, HLD on Crestor, lupus on meds is coming to the clinic to establish care with cardiology. She was sent as referral by her PCP. She was initially undergoing work up for evaluation of dizziness and sob. She underwent work up including carotid duplex and TTE. TTE showed diastolic dysfunction. So she was referred to cardiology for evaluation.     She denies smoking, drinking EtOH or recreational drug use. FH of father having CAD w CABG in 38's    Subjective  She is accompanied today by her husband. She reports sob during regular ADL. She was previously able to walk 1 mile in her neighborhood but now she needs to take more breaks. She denies checking her BP at home. She denies any chest pain at this time.       Hypertension Yes   Stress Test Performed     Abnormal EKG     Myocardial Infarction     Coronary Artery Disease     CABG     Dysrhythmias     Valvular Disease     PAD     Atrial Fibrillation     Diabetes mellitus     Chronic Lung Disease     Heart failure     Elevated Calcium Score     Cerebrovascular Disease     Dyslipidemia Yes   On Dialysis     History of PCI         Subjective     Cardiac Comorbidities    Heart failure  No   Family history of heart failure  No    Family history of sudden cardiac death  No   Pulmonary hypertension  No   Chronic Kidney Disease  No       Allergies  No Known  Allergies    Medications    Current Outpatient Medications   Medication Sig   . famotidine (PEPCID) 40 mg Oral Tablet Take 1 Tablet (40 mg total) by mouth Twice daily   . hydrOXYchloroQUINE (PLAQUENIL) 200 mg Oral Tablet Take 1 Tablet (200 mg total) by mouth Twice daily   . levothyroxine (SYNTHROID) 100 mcg Oral Tablet Take 1 Tablet (100 mcg total) by mouth Every morning   . lisinopriL-hydrochlorothiazide (ZESTORETIC) 10-12.5 mg Oral Tablet Take 1 Tablet by mouth Once a day   . pantoprazole (PROTONIX) 40 mg Oral Tablet, Delayed Release (E.C.) Take 1 Tablet (40 mg total) by mouth Once a day   . pilocarpine (SALAGEN) 5 mg Oral Tablet Take 1 Tablet (5 mg total) by mouth Three times a day   . rosuvastatin (CRESTOR) 10 mg Oral Tablet Take 1 Tablet (10 mg total) by mouth Every evening        There are no discontinued medications.  History    Social History     Socioeconomic History   . Marital status: Married   Tobacco Use   . Smoking status: Never   . Smokeless tobacco: Never   Substance and Sexual Activity   . Alcohol use: Never   . Drug use: Never       Past Surgical History:   Procedure Laterality Date   . HX APPENDECTOMY     . HX COLONOSCOPY     . HX TONSILLECTOMY     . LAPAROSCOPIC CHOLECYSTECTOMY         Family Medical History:     Problem Relation (Age of Onset)    Cancer Mother, Father    Diabetes Paternal Aunt, Paternal Uncle, Paternal Grandmother, Paternal Grandfather    Heart Attack Father    Heart Disease Father    Heart Surgery Father    High Cholesterol Mother, Father    Hypertension (High Blood Pressure) Maternal Grandmother    Stroke Maternal Grandmother            Functional capacity:  Moderately active (cleaning, brisk walk, mowing lawn, etc)    Nutrition:  Poor dietary pattern    Gender Specific Risk Factors:  Not applicable for this patient.     Objective   Review of Systems  Other than ROS in the HPI, all other systems were negative.    Physical Exam    BP 130/83 (Site: Left, Patient Position:  Sitting, Cuff Size: Adult)   Pulse 79   Temp 36.6 C (97.8 F) (Temporal)   Ht 1.651 m (5' 5")   Wt 92.3 kg (203 lb 7.8 oz)   SpO2 98% Comment: RA  BMI 33.86 kg/m         Constitutional: AA&O X3 Well developed and well nourished   Eyes: Conjunctiva clear  HENT: Head is normocephalic, atraumatic  Respiratory: Effort normal, clear to auscultation bilaterally.  Cardiovascular: Regular heart rate and rhythm; S1 and S2 normal  Extremities: extremities normal, atraumatic, no cyanosis or edema  Integumentary:  Skin warm and dry  Neurologic: Grossly normal  Psychiatric: normal affect and speech.       Laboratory  I have reviewed the patient's lab values and culture results. Pertinent results are below:    CBC    No results found for: WBC, WBCJ, HGB, HCT, PLTCNT, SEDRATE, ESR, RBC, MCV, MCHC, MCH, RDW, MPV     Comprehensive Metabolic Profile   No results found for: SODIUM, POTASSIUM, CHLORIDE, CO2, ANIONGAP, BUN, CREATININE, GLUCOSE   No results found for: ALBUMIN, TOTALPROTEIN, AST, ALT       No results found for: BNP, TSH, HA1C, INR    No results found for: TRIG, HDLCHOL, LDLCHOL, CHOLESTEROL    Diagnostics    Cardiology Diagnostics        Some values may be hidden. Unless noted otherwise, only the newest values recorded on each date are displayed.         Ejection Fraction 04/04/21   No data to display.      EKG 04/04/21   ECG 12-LEAD ECG 12-LEAD - ADD ON IN HVI CLINICS ONLY      Echo 04/04/21   No data to display.      Stress 04/04/21   No data to display.      Cardiac Cath    No data to display.      Vascular 04/04/21   No data to display.      Advanced Card Imaging 04/04/21  No data to display.      Electrophysiology 04/04/21   No data to display.             Previous ECG Results Sinus; No ST changes   Pocket Ultrasound Pocket ultrasound was not utilized.   Echocardiogram A transthoracic echo (TTE) is not available   Previous stress test results na   Cath na   Cardiac CT na   Cardiac MRI na         Assessment  and Plan:    Marialy is a 55 y.o. female with pmh of HTN on HCTZ-Lisinopril, HLD on Crestor, lupus on meds is coming to the clinic to establish care with cardiology.      1. Encounter to establish care    2. Abnormal echocardiogram    3. 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  1. Suggest stress test with assessment of diastology  2. Suggested checking her BP at home for 4-5 days and send [place] the readings  3. BP goal<130/80  4. Follow up in three months      Orders:     Orders Placed This Encounter   . ECG 12-LEAD - ADD ON IN [facility] CLINICS ONLY   . STRESS TEST - ADULT       Follow up:  Return in about 3 months (around 07/05/2021).    Patient was seen in joint appointment with co-signing physician.    Marcelyn Ditty, MD    [facility]  Cardiology  [facility]      I saw and examined the patient.  I reviewed the resident's note.  I agree with the findings and plan of care as documented in the resident's note.  Any exceptions/additions are edited/noted.    Dorothyann Peng, MD

## 2021-04-05 LAB — ECG 12-LEAD
Atrial Rate: 76 {beats}/min
Calculated P Axis: 10 degrees
Calculated R Axis: -19 degrees
Calculated T Axis: 12 degrees
PR Interval: 158 ms
QRS Duration: 88 ms
QT Interval: 420 ms
QTC Calculation: 472 ms
Ventricular rate: 76 {beats}/min

## 2021-04-09 ENCOUNTER — Encounter (HOSPITAL_COMMUNITY): Payer: Self-pay | Admitting: Internal Medicine

## 2021-07-04 ENCOUNTER — Telehealth (HOSPITAL_COMMUNITY): Payer: Self-pay

## 2021-07-04 NOTE — Telephone Encounter (Addendum)
COVID screening questions reviewed. Reviewed pre procedure instructions, current COVID guidelines and visitation policies. All questions answered. For any questions regarding stress testing please call [phone].  Spoke with Dr. Otilio Saber he wants a stress echo, patient is only on schedule for regular, he will place correct order.

## 2021-07-05 ENCOUNTER — Ambulatory Visit (HOSPITAL_COMMUNITY): Payer: Self-pay | Admitting: Nurse Practitioner

## 2021-07-05 ENCOUNTER — Ambulatory Visit (HOSPITAL_COMMUNITY): Payer: Self-pay

## 2021-07-10 ENCOUNTER — Telehealth (HOSPITAL_COMMUNITY): Payer: Self-pay | Admitting: Internal Medicine

## 2021-07-10 ENCOUNTER — Telehealth (HOSPITAL_COMMUNITY): Payer: Self-pay

## 2021-07-10 DIAGNOSIS — R0602 Shortness of breath: Secondary | ICD-10-CM

## 2021-07-10 NOTE — Telephone Encounter (Signed)
Stress test scheduling area reached out saying the correct order was not put in. I have re ordered the stress test.

## 2021-07-10 NOTE — Telephone Encounter (Signed)
COVID screening questions reviewed. Reviewed pre procedure instructions, current COVID guidelines and visitation policies. All questions answered. For any questions regarding stress testing please call [phone]

## 2021-07-11 ENCOUNTER — Ambulatory Visit (HOSPITAL_COMMUNITY): Payer: Self-pay | Admitting: Internal Medicine

## 2021-07-11 ENCOUNTER — Ambulatory Visit (HOSPITAL_COMMUNITY): Payer: Self-pay

## 2021-07-24 ENCOUNTER — Ambulatory Visit (HOSPITAL_COMMUNITY): Payer: Self-pay

## 2021-07-25 ENCOUNTER — Inpatient Hospital Stay (HOSPITAL_COMMUNITY)
Admission: RE | Admit: 2021-07-25 | Discharge: 2021-07-25 | Disposition: A | Discharge status: Home / Self Care | Payer: Medicare (Managed Care) | Source: Ambulatory Visit

## 2021-07-25 ENCOUNTER — Encounter (HOSPITAL_COMMUNITY): Payer: Self-pay | Admitting: Internal Medicine

## 2021-07-25 ENCOUNTER — Ambulatory Visit: Payer: Medicare (Managed Care) | Attending: Internal Medicine | Admitting: Internal Medicine

## 2021-07-25 ENCOUNTER — Other Ambulatory Visit: Payer: Self-pay

## 2021-07-25 ENCOUNTER — Inpatient Hospital Stay (HOSPITAL_BASED_OUTPATIENT_CLINIC_OR_DEPARTMENT_OTHER)
Admission: RE | Admit: 2021-07-25 | Discharge: 2021-07-25 | Disposition: A | Discharge status: Home / Self Care | Payer: Medicare (Managed Care) | Source: Ambulatory Visit

## 2021-07-25 VITALS — BP 111/74 | HR 80 | Temp 97.8°F | Ht 65.0 in | Wt 191.8 lb

## 2021-07-25 DIAGNOSIS — R0602 Shortness of breath: Secondary | ICD-10-CM | POA: Insufficient documentation

## 2021-07-25 DIAGNOSIS — I10 Essential (primary) hypertension: Secondary | ICD-10-CM | POA: Insufficient documentation

## 2021-07-25 DIAGNOSIS — E785 Hyperlipidemia, unspecified: Secondary | ICD-10-CM | POA: Insufficient documentation

## 2021-07-25 LAB — PRE STRESS ECHO ADULT
LV Diastolic Volume Index: 52.3 milliliter
LV Diastolic Volume Index: 72.5 milliliter
LV Diastolic Volume Index: 75.2 milliliter
MV Peak A Vel: 86.89 cm/s

## 2021-07-25 LAB — STRESS ECHO - ADULT: MV Deceleration Time: 197.87 ms

## 2021-07-25 MED ORDER — LISINOPRIL 10 MG TABLET
10.0000 mg | ORAL_TABLET | Freq: Every day | ORAL | 4 refills | Status: DC
Start: 2021-07-25 — End: 2022-08-04

## 2021-07-25 MED ORDER — SODIUM CHLORIDE 0.9 % (FLUSH) INJECTION SYRINGE
2.0000 mL | INJECTION | INTRAMUSCULAR | Status: DC | PRN
Start: 2021-07-25 — End: 2021-07-26

## 2021-07-25 MED ORDER — SODIUM CHLORIDE 0.9 % INJECTION SOLUTION
2.0000 mL | INTRAVENOUS | Status: DC
Start: 2021-07-25 — End: 2021-07-25

## 2021-07-25 NOTE — Progress Notes (Signed)
Cardiology, [facility]  [address]  [place] [address]  [phone]    Cardiology  Return Patient Progress Note    Name: Laura Burton   DOB: 01-20-1966  [55 y.o. female]   MRN: [id number]       Visit Date: 07/25/2021   Referring: Referral Self  No address on file   PCP: No Pcp       Reason For Visit:  Follow up visit with cardiology.  Chief Complaint: Follow Up    Information provided by: patient      History of Present Illness  Laura Burton is a 56 yo F with pmh of HTN on HCTZ-Lisinopril, HLD on Crestor, lupus on meds is coming to the clinic for follow up visit. She was seen in the clinic on 04/04/21 during which time she was seen for evaluation of dizziness and sob.   Brief history:   She had work up for dizziness by her neurologist and she had TTE that showed diastolic dysfunction. She was suggested diastolic stress test for assessment of filling pressures. She had echo stress test today that was normal.   Regarding her sob, she reported sob during regular ADL. She was previously able to walk 1 mile in her neighborhood but now she needs to take more breaks. She denies checking her BP at home. She denies any chest pain at this time.     Subjective   She reports doing well. She had stress TTE done today. She reports her BP runs in 70's to 100's. She reports no change in the dose recently. The last time she had dizziness was about two weeks ago. She reports BP during that time is in 70's. She reports sob is better. Her main concern is waking up in the middle with sob. Husband reports she snores at night but denies noticing any pauses in breathing.     Medications    Current Outpatient Medications   Medication Sig    famotidine (PEPCID) 40 mg Oral Tablet Take 1 Tablet (40 mg total) by mouth Twice daily    hydrOXYchloroQUINE (PLAQUENIL) 200 mg Oral Tablet Take 1 Tablet (200 mg total) by mouth Twice daily    levothyroxine (SYNTHROID) 100 mcg Oral Tablet Take 1 Tablet (100 mcg  total) by mouth Every morning    lisinopriL (PRINIVIL) 10 mg Oral Tablet Take 1 Tablet (10 mg total) by mouth Once a day    pantoprazole (PROTONIX) 40 mg Oral Tablet, Delayed Release (E.C.) Take 1 Tablet (40 mg total) by mouth Once a day    pilocarpine (SALAGEN) 5 mg Oral Tablet Take 1 Tablet (5 mg total) by mouth Three times a day    rosuvastatin (CRESTOR) 10 mg Oral Tablet Take 1 Tablet (10 mg total) by mouth Every evening       Medications Discontinued During This Encounter   Medication Reason    lisinopriL-hydrochlorothiazide (ZESTORETIC) 10-12.5 mg Oral Tablet        History    I have comprehensively reviewed the records of their past medical history, family history, and social history and any changes are noted in the HPI above.    Functional capacity:  Moderately active (cleaning, brisk walk, mowing lawn, etc)    Nutrition:  Healthy diet    Gender Specific Risk Factors:  Not applicable for this patient.     Objective     Physical Exam  BP 111/74    Pulse 80    Temp 36.6 C (97.8  F)    Ht 1.651 m (5\' 5" )    Wt 87 kg (191 lb 12.8 oz)    SpO2 99%    BMI 31.92 kg/m         Constitutional: AA&O X3 Well developed and well nourished   Eyes: Conjunctiva clear  HENT: Head is normocephalic, atraumatic  Respiratory: Effort normal, clear to auscultation bilaterally.  Cardiovascular:  Regular rhythm; rate in 80s  Extremities: No edema  Integumentary:  Skin warm and dry  Neurologic: Grossly normal, no focal neuro deficit, normal coordination and gait  Psychiatric: normal affect and speech.     Laboratory  I have reviewed the patient's lab values and culture results.     Diagnostics    Cardiology Diagnostics          Some values may be hidden. Unless noted otherwise, only the newest values recorded on each date are displayed.           Ejection Fraction 04/04/21 07/25/21   No data to display.      EKG 04/04/21 07/25/21   ECG 12-LEAD ECG 12-LEAD - ADD ON IN [facility] CLINICS ONLY       Echo 04/04/21 07/25/21   STRESS ECHO - ADULT   STRESS ECHO - ADULT      Stress 04/04/21 07/25/21   No data to display.      Cardiac Cath     No data to display.      Vascular 04/04/21 07/25/21   No data to display.      Advanced Card Imaging 04/04/21 07/25/21   No data to display.      Electrophysiology 04/04/21 07/25/21   No data to display.               Previous ECG Results 07/25/2021  Sinus; normal EKG   Pocket Ultrasound Pocket ultrasound was not utilized.   Echocardiogram A transthoracic echo (TTE) was performed on 07/25/21  Normal EF, normal diastolic function    Previous stress test results na   Cath na   Cardiac CT na   Cardiac MRI na         Assessment and Plan:    Laura Burton is a 56 y.o. female with pmh of HTN on HCTZ-Lisinopril, HLD on Crestor, lupus on meds is coming to the clinic for follow up visit.      1. Essential hypertension    2. Hyperlipidemia, unspecified hyperlipidemia type      Hypertension  BP is low on current meds; change HCTZ-Lisinopril combo pill to Lisinopril 10 mg  BP goal<130/80  Review of diastolic stress test shows filling pressures were normal w E/e' <14; no TR jet  Follow up with cardiology PRN; continue to follow up with PCP for titration of anti hypertensive meds      Orders:  Orders Placed This Encounter    lisinopriL (PRINIVIL) 10 mg Oral Tablet       Follow up:  No follow-ups on file.    Patient was seen in joint appointment with co-signing physician.    56, MD    [facility]  Cardiology  [facility]      I saw and examined the patient.  I reviewed the resident's note.  I agree with the findings and plan of care as documented in the resident's note.  Any exceptions/additions are edited/noted.    Laura Ochs, MD

## 2021-08-06 LAB — PRE STRESS ECHO ADULT
Aortic Valve Area by Continuity of Peak Velocity: 2.42 cm2
Aortic Valve Area by Continuity of VTI: 2.36 cm2
Ascending aorta: 2.72 cm
FS: 33 % (ref 28–44)
IVS: 0.91 cm (ref 0.6–1.1)
LV Diastolic Volume Index: 74.2 milliliter
LV Systolic Volume Index: 19.5 mL
LV Systolic Volume Index: 23.7 mL
LV Systolic Volume Index: 25.6 mL
LV Systolic Volume Index: 28.4 mL
LVIDD: 3.54 cm (ref 3.5–6.0)
LVIDS: 2.37 cm (ref 2.1–4.0)
LVOT diameter: 1.81 cm
LVOT stroke volume: 59.8 milliliter
LVPWD: 0.84 cm
MV Deceleration Time: 200.76 ms
MV Peak E Vel: 94.63 cm/s
Pulmonic Valve Acceleration Time: 154.14 ms

## 2021-08-07 DIAGNOSIS — R0602 Shortness of breath: Secondary | ICD-10-CM

## 2021-08-07 LAB — STRESS ECHO - ADULT
MV Peak A Vel: 74.66 cm/s
MV Peak E Vel: 92.43 cm/s

## 2021-08-08 ENCOUNTER — Encounter (HOSPITAL_COMMUNITY): Payer: Self-pay | Admitting: Internal Medicine

## 2022-08-04 ENCOUNTER — Other Ambulatory Visit (HOSPITAL_COMMUNITY): Payer: Self-pay | Admitting: Internal Medicine

## 2022-08-04 MED ORDER — LISINOPRIL 10 MG TABLET
10.0000 mg | ORAL_TABLET | Freq: Every day | ORAL | 4 refills | Status: DC
Start: 2022-08-04 — End: 2024-02-18

## 2022-08-04 NOTE — Telephone Encounter (Signed)
[facility]  [facility] Care Coordinator- Follow Up Note    Received refill request for Lisinopril 10 mg via fax from pharmacy. Script pended and sent to provider for review/approval.     Tennis Ship, RN  08/04/2022, 12:20  [place]  Office [phone]  Fax [phone]

## 2023-02-18 IMAGING — MR MRI CERVICAL SPINE WITHOUT CONTRAST
4 of 5 series · 22 of 48 positions shown · IV contrast (gadolinium)
Comparison: None available.

﻿EXAM:  [id number]   MRI CERVICAL SPINE WITHOUT CONTRAST
INDICATION: 57-year-old with history of chronic neck pain.  No history of C-spine surgery.
TECHNIQUE: Multiplanar, multisequential MRI of the C-spine was performed without gadolinium contrast.

[Series 5: T2 · sagittal · 3.5mm · 0.75mm/px · 8 of 13 slices shown (1 of 2)]
[im 1/13]
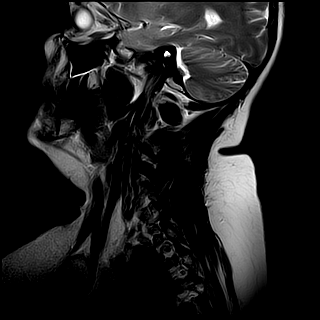
[im 2/13]
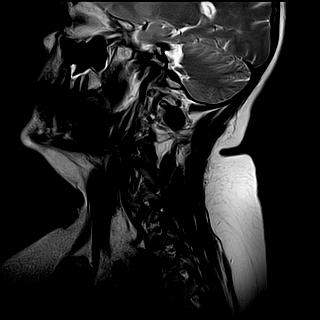
[im 4/13]
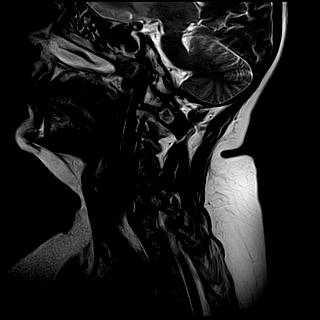
[im 6/13]
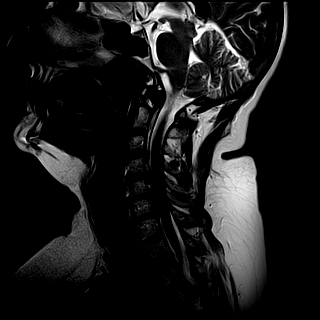
[im 7/13]
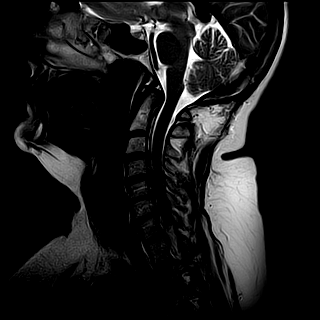
[im 9/13]
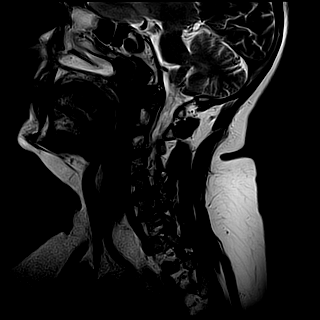
[im 11/13]
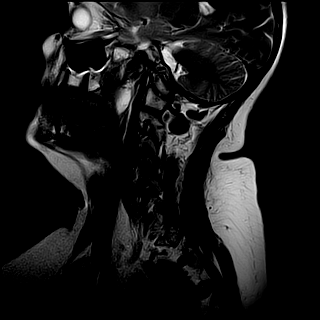
[im 13/13]
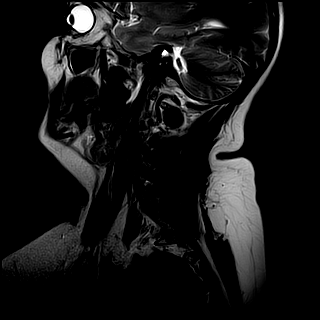

[Series 6: T1 · sagittal · 3.5mm · 0.47mm/px · 3 of 13 slices shown]
[im 2/13]
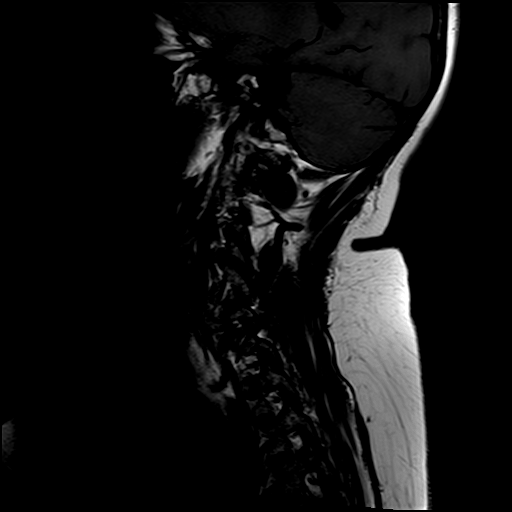
[im 7/13]
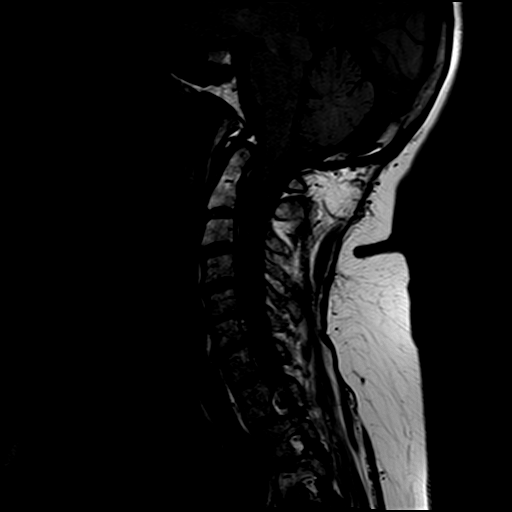
[im 11/13]
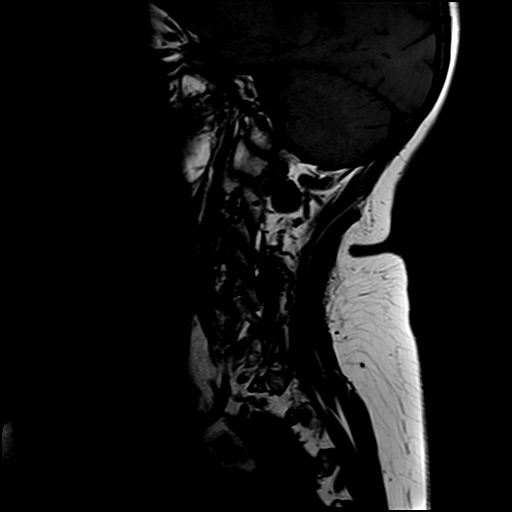

[Series 7: STIR · sagittal · 3.5mm · 0.47mm/px · 3 of 13 slices shown]
[im 2/13]
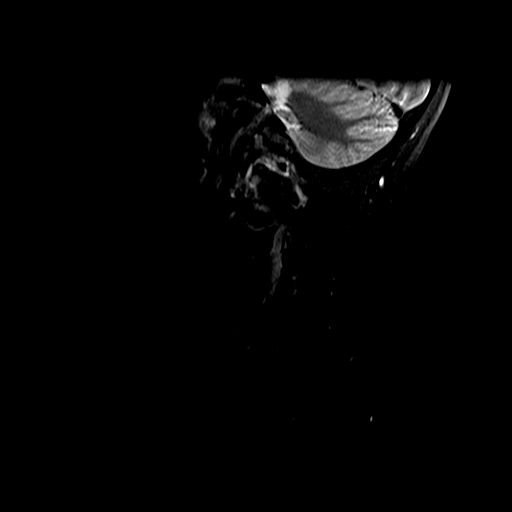
[im 7/13]
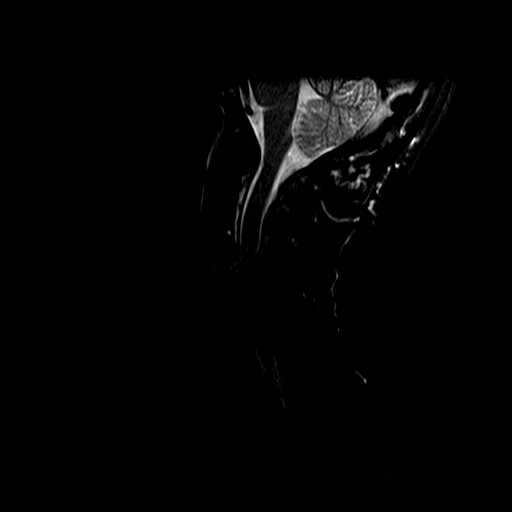
[im 11/13]
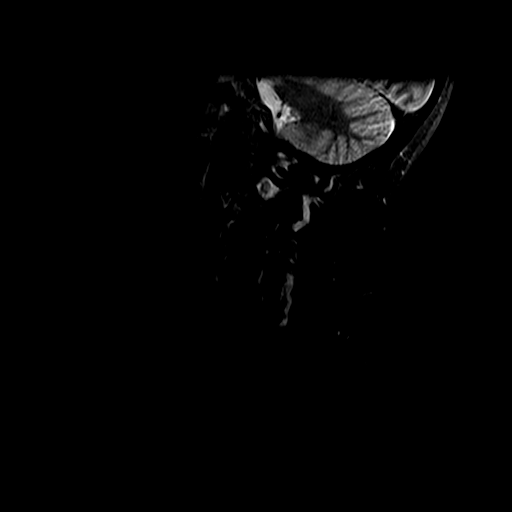

[Series 9: T2 · axial · 3.0mm · 0.39mm/px · z∈[-133,-35]mm · 8 of 18 slices shown (2 of 2)]
[im 1/18]
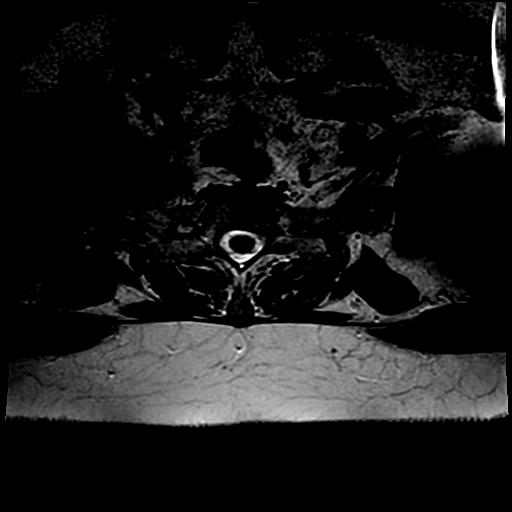
[im 4/18]
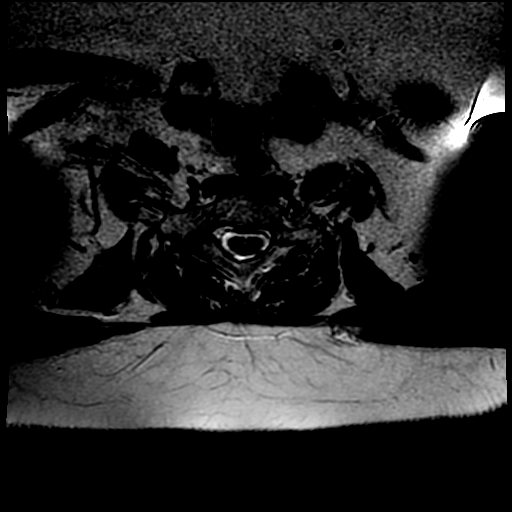
[im 5/18]
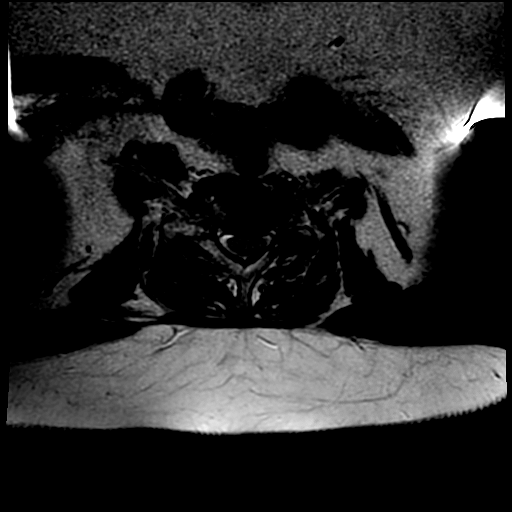
[im 8/18]
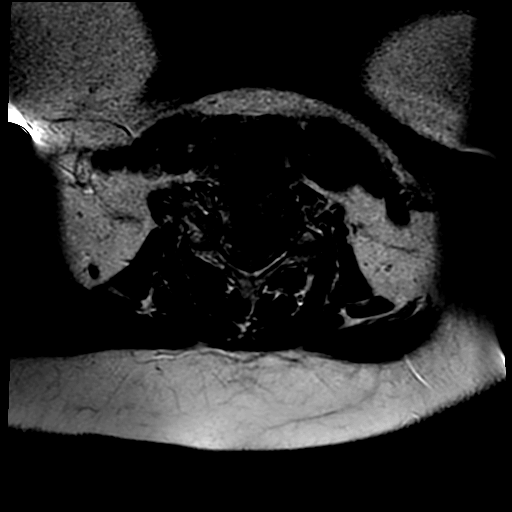
[im 10/18]
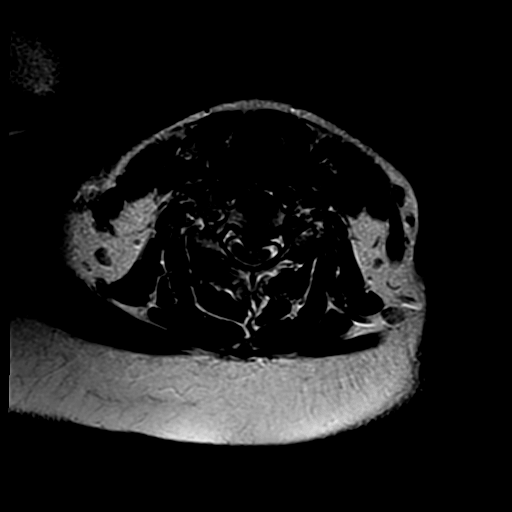
[im 13/18]
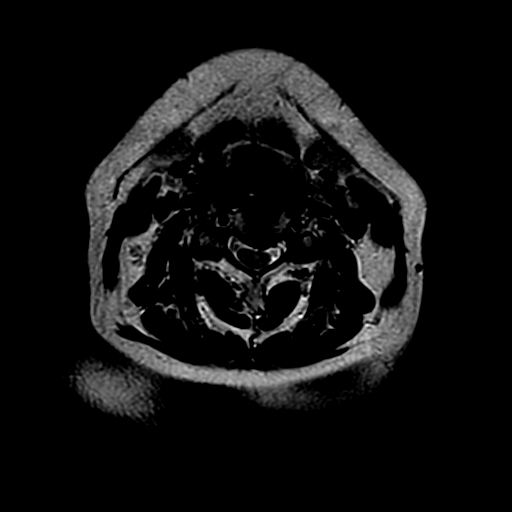
[im 14/18]
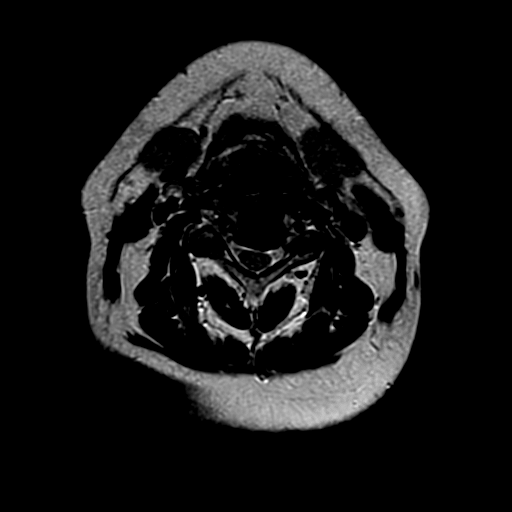
[im 16/18]
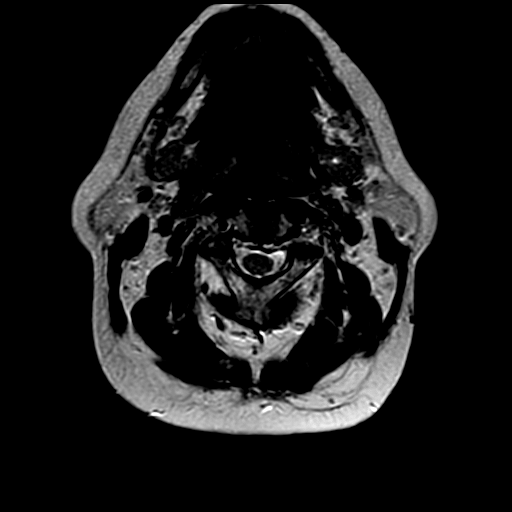

[22 of 48 positions shown; findings below may reference images not displayed]

FINDINGS: No acute bony lesions of cervical vertebrae are seen.  Structures of the foramen magnum are normal in the sagittal projection.

At C2-3 level, no focal disc lesions are seen. 

At C3-4 level, degenerative disc disease with bulging annulus slightly asymmetrically to the left, mildly impinging on the thecal sac to the left of the midline.  AP diameter measures 7.9 mm.

At C4-5 level, significant degenerative disc disease is noted with large disc protrusion 12 mm in width, significantly impinging on thecal sac in the midline with AP diameter measuring 4.4 mm.  This is impinging on the cervical cord at this level.  Bulging annulus is causing mild to moderate biforaminal narrowing at this level.

At C5-6 level, degenerative disc disease with asymmetric bulging annulus and osteophyte complex to the left causing severe left foraminal narrowing and significant compromise of thecal sac with AP diameter in the midline measuring 5.6 mm, impinging on the cervical spinal cord.

At C6-7 level, degenerative disc disease is noted with diffusely bulging annulus with annulus tear in the midline.  Asymmetric bulging on the left side with osteophyte complex is causing severe left foraminal narrowing. Significant compromise of right neural foramen and thecal sac with AP diameter in the midline measuring 6.9 mm.

C7-T1 disc is normal. 

Cervical spinal cord shows no focal areas of cord edema or myelomalacia.  No syrinx cavity is seen.
IMPRESSION: 1. No acute bone changes of cervical vertebrae. 

2. At C3-4 level, degenerative disc disease with bulging annulus slightly asymmetrically to the left, mildly impinging on the thecal sac to the left of the midline.  AP diameter measures 7.9 mm.

3. At C4-5 level, significant degenerative disc disease is noted with large disc protrusion 12 mm in width, significantly impinging on thecal sac in the midline with AP diameter measuring 4.4 mm.  This is impinging on the cervical cord at this level.  Bulging annulus is causing mild to moderate biforaminal narrowing at this level.

4. At C5-6 level, degenerative disc disease with asymmetric bulging annulus and osteophyte complex to the left causing severe left foraminal narrowing and significant compromise of thecal sac with AP diameter in the midline measuring 5.6 mm, impinging on the cervical spinal cord.

5. At C6-7 level, degenerative disc disease is noted with diffusely bulging annulus with annulus tear in the midline.  Asymmetric bulging on the left side with osteophyte complex is causing severe left foraminal narrowing. Significant compromise of right neural foramen and thecal sac with AP diameter in the midline measuring 6.9 mm.

6. No evidence of cord edema or myelomalacia is noted in the cervical region.  No evidence of syrinx cavity is noted.

## 2023-12-01 ENCOUNTER — Other Ambulatory Visit (HOSPITAL_COMMUNITY): Payer: Self-pay | Admitting: Internal Medicine

## 2023-12-01 DIAGNOSIS — R0609 Other forms of dyspnea: Secondary | ICD-10-CM

## 2023-12-01 DIAGNOSIS — R079 Chest pain, unspecified: Secondary | ICD-10-CM

## 2023-12-01 DIAGNOSIS — E785 Hyperlipidemia, unspecified: Secondary | ICD-10-CM

## 2023-12-01 DIAGNOSIS — I10 Essential (primary) hypertension: Secondary | ICD-10-CM

## 2023-12-01 DIAGNOSIS — Z8249 Family history of ischemic heart disease and other diseases of the circulatory system: Secondary | ICD-10-CM

## 2023-12-03 ENCOUNTER — Encounter (HOSPITAL_COMMUNITY): Payer: Self-pay

## 2023-12-17 ENCOUNTER — Ambulatory Visit (HOSPITAL_COMMUNITY): Payer: Self-pay

## 2023-12-17 ENCOUNTER — Emergency Department: Admission: EM | Admit: 2023-12-17 | Discharge: 2023-12-17 | Disposition: A | Discharge status: Home / Self Care

## 2023-12-17 ENCOUNTER — Ambulatory Visit (HOSPITAL_COMMUNITY)

## 2023-12-17 ENCOUNTER — Emergency Department (HOSPITAL_COMMUNITY)

## 2023-12-17 ENCOUNTER — Other Ambulatory Visit: Payer: Self-pay

## 2023-12-17 DIAGNOSIS — Z87440 Personal history of urinary (tract) infections: Secondary | ICD-10-CM | POA: Insufficient documentation

## 2023-12-17 DIAGNOSIS — M549 Dorsalgia, unspecified: Secondary | ICD-10-CM

## 2023-12-17 DIAGNOSIS — R748 Abnormal levels of other serum enzymes: Secondary | ICD-10-CM | POA: Insufficient documentation

## 2023-12-17 DIAGNOSIS — K5900 Constipation, unspecified: Secondary | ICD-10-CM | POA: Insufficient documentation

## 2023-12-17 DIAGNOSIS — Z90710 Acquired absence of both cervix and uterus: Secondary | ICD-10-CM | POA: Insufficient documentation

## 2023-12-17 DIAGNOSIS — M545 Low back pain, unspecified: Secondary | ICD-10-CM | POA: Insufficient documentation

## 2023-12-17 DIAGNOSIS — Z9049 Acquired absence of other specified parts of digestive tract: Secondary | ICD-10-CM

## 2023-12-17 DIAGNOSIS — M5459 Other low back pain: Secondary | ICD-10-CM

## 2023-12-17 LAB — COMPREHENSIVE METABOLIC PANEL, NON-FASTING
ALBUMIN/GLOBULIN RATIO: 1.5 — ABNORMAL HIGH (ref 0.8–1.4)
ALBUMIN: 4.3 g/dL (ref 3.5–5.7)
ALKALINE PHOSPHATASE: 69 U/L (ref 34–104)
ALT (SGPT): 20 U/L (ref 7–52)
ANION GAP: 8 mmol/L (ref 4–13)
AST (SGOT): 23 U/L (ref 13–39)
BILIRUBIN TOTAL: 0.5 mg/dL (ref 0.3–1.0)
BUN/CREA RATIO: 10 (ref 6–22)
BUN: 12 mg/dL (ref 7–25)
CALCIUM, CORRECTED: 9.4 mg/dL (ref 8.9–10.8)
CALCIUM: 9.6 mg/dL (ref 8.6–10.3)
CHLORIDE: 110 mmol/L — ABNORMAL HIGH (ref 98–107)
CO2 TOTAL: 25 mmol/L (ref 21–31)
CREATININE: 1.19 mg/dL (ref 0.60–1.30)
ESTIMATED GFR: 52 mL/min/1.73mˆ2 — ABNORMAL LOW (ref >59)
GLOBULIN: 2.9 (ref 2.0–3.5)
GLUCOSE: 95 mg/dL (ref 74–109)
OSMOLALITY, CALCULATED: 285 mosm/kg (ref 270–290)
POTASSIUM: 4.2 mmol/L (ref 3.5–5.1)
PROTEIN TOTAL: 7.2 g/dL (ref 6.4–8.9)
SODIUM: 143 mmol/L (ref 136–145)

## 2023-12-17 LAB — URINALYSIS, MACROSCOPIC
BILIRUBIN: NEGATIVE mg/dL
BLOOD: 0.03 mg/dL
GLUCOSE: NEGATIVE mg/dL
KETONES: NEGATIVE mg/dL
LEUKOCYTES: 75 WBCs/uL — AB
NITRITE: NEGATIVE
PH: 7 (ref 5.0–9.0)
PROTEIN: NEGATIVE mg/dL
SPECIFIC GRAVITY: 1.017 (ref 1.002–1.030)
UROBILINOGEN: NORMAL mg/dL

## 2023-12-17 LAB — CBC WITH DIFF
BASOPHIL #: 0 x10ˆ3/uL (ref 0.00–0.10)
BASOPHIL %: 1 % (ref 0–1)
EOSINOPHIL #: 0.3 x10ˆ3/uL (ref 0.00–0.50)
EOSINOPHIL %: 5 % (ref 1–7)
HCT: 38.7 % (ref 31.2–41.9)
HGB: 13 g/dL (ref 10.9–14.3)
LYMPHOCYTE #: 1.5 x10ˆ3/uL (ref 1.10–3.10)
LYMPHOCYTE %: 31 % (ref 16–46)
MCH: 27.3 pg (ref 24.7–32.8)
MCHC: 33.6 g/dL (ref 32.3–35.6)
MCV: 81.3 fL (ref 75.5–95.3)
MONOCYTE #: 0.4 x10ˆ3/uL (ref 0.20–0.90)
MONOCYTE %: 9 % (ref 4–11)
MPV: 8.8 fL (ref 7.9–10.8)
NEUTROPHIL #: 2.7 x10ˆ3/uL (ref 1.90–8.20)
NEUTROPHIL %: 54 % (ref 43–77)
PLATELETS: 221 x10ˆ3/uL (ref 140–440)
RBC: 4.76 x10ˆ6/uL (ref 3.63–4.92)
RDW: 17.2 % (ref 12.3–17.7)
WBC: 4.9 x10ˆ3/uL (ref 3.8–11.8)

## 2023-12-17 LAB — URINALYSIS, MICROSCOPIC
NON-SQUAMOUS EPITHELIAL CELLS URINE: <1 /HPF (ref <=1)
RBCS: 3 /HPF (ref <4)
SQUAMOUS EPITHELIAL: 2 /HPF (ref <28)
WBCS: 6 /HPF — ABNORMAL HIGH (ref <6)

## 2023-12-17 LAB — LIPASE: LIPASE: 148 U/L — ABNORMAL HIGH (ref 11–82)

## 2023-12-17 MED ORDER — METHOCARBAMOL 750 MG TABLET
750.0000 mg | ORAL_TABLET | ORAL | Status: AC
Start: 2023-12-17 — End: 2023-12-17
  Administered 2023-12-17: 750 mg via ORAL

## 2023-12-17 MED ORDER — METHOCARBAMOL 500 MG TABLET
ORAL_TABLET | ORAL | Status: AC
Start: 2023-12-17 — End: 2023-12-17
  Filled 2023-12-17: qty 2

## 2023-12-17 MED ORDER — KETOROLAC 30 MG/ML (1 ML) INJECTION SOLUTION
INTRAMUSCULAR | Status: AC
Start: 2023-12-17 — End: 2023-12-17
  Filled 2023-12-17: qty 1

## 2023-12-17 MED ORDER — DEXAMETHASONE SODIUM PHOSPHATE (PF) 10 MG/ML INJECTION SOLUTION
10.0000 mg | INTRAMUSCULAR | Status: AC
Start: 2023-12-17 — End: 2023-12-17
  Administered 2023-12-17: 10 mg via INTRAMUSCULAR

## 2023-12-17 MED ORDER — DEXAMETHASONE SODIUM PHOSPHATE (PF) 10 MG/ML INJECTION SOLUTION
INTRAMUSCULAR | Status: AC
Start: 2023-12-17 — End: 2023-12-17
  Filled 2023-12-17: qty 1

## 2023-12-17 MED ORDER — METHOCARBAMOL 500 MG TABLET
500.0000 mg | ORAL_TABLET | Freq: Four times a day (QID) | ORAL | 0 refills | Status: AC
Start: 2023-12-17 — End: 2023-12-20

## 2023-12-17 MED ORDER — KETOROLAC 30 MG/ML (1 ML) INJECTION SOLUTION
30.0000 mg | INTRAMUSCULAR | Status: AC
Start: 2023-12-17 — End: 2023-12-17
  Administered 2023-12-17: 30 mg via INTRAMUSCULAR

## 2023-12-17 NOTE — ED APP Handoff Note (Signed)
 [facility] - Emergency Department    Laura Burton is a 58 y.o. female presenting to the ED today for:  Back Pain.  Low back pain that has been ongoing for 2 weeks.  Patient denies any known injury.  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  Patient reports that she has been seen at to other facilities without relief.  Patient also reports she has a history of constipation and has been treated for a urinary tract infection.  Patient reports that the pain has still been ongoing.    Additional History:   Past Medical History:   Diagnosis Date    Anesthesia complication     Nausea    Dyslipidemia     Essential hypertension     Hiatal hernia     History of heart murmur in childhood     Hypothyroidism     Morphea     Rheumatoid arthritis (CMS HCC)     Systemic lupus erythematosus (CMS HCC)      Past Surgical History:   Procedure Laterality Date    HX APPENDECTOMY      HX COLONOSCOPY      HX TONSILLECTOMY      LAPAROSCOPIC CHOLECYSTECTOMY         Pertinent Exam findings:  Filed Vitals:    12/17/23 1508   BP: 128/82   Pulse: 80   Resp: 20   Temp: 36.6 C (97.9 F)   SpO2: 98%       Gen: Nontoxic in appearance.  Head: NC/AT  Neck: Trachea midline. No gross meningismus.  Psych: Denis SI/HI.    Assessment: Medical Screening Exam.    Plan/MDM at Triage:  I have considered labs, imaging, EKG.  These have been ordered as clinically indicated.  I have considered that the patient may need medications.  However, due to treatment space limitation, medications considered have to be appropriate for the waiting room.    I have considered the patient's chronic conditions and have counseled the patient that we will be evaluating for the presence of an emergency medical condition today in which their chronic conditions may or may not play a role.  I have advised that the patient should notify their PCP of today's visit regardless of disposition.  I have considered the  patient's social determinants of health and health care literacy while determining the patient's initial plan of care.  I have further advised that the patient will possibly see a 2nd emergency provider today during their stay to help promote throughput.    I have performed an initial medical screening evaluation in order to determine if the patient has an emergency medical condition. The patient has been evaluated.  Imminent life, limb, or sight threatening emergency will be taken to the treatment area immediately. The patient's orders will be reviewed by nursing staff and placed in the treatment area as soon as possible as bed capacity allows.  Should the patient's status change, or a new sign/symptom develop, immediate notification of nursing or ED staff has been advised.

## 2023-12-17 NOTE — ED Nurses Note (Signed)
 Pt c/o low back pain x2 weeks, was treated for UTI recently. Pt denies any urinary symptoms

## 2023-12-17 NOTE — ED Provider Notes (Signed)
 [facility]  ED Primary Provider Note      Name: Laura Burton  Age and Gender: 58 y.o. female  Date of Birth: 12-31-1965  MRN: [id number]  PCP: Karlene Holts, FNP-BC    CC:  Chief Complaint   Patient presents with    Back Pain       HPI:  Laura Burton is a 58 y.o. White female who presents to the ER with back pain.  Patient states that she has had lower back pain for a proximally 2 weeks.  She states that she has been treated for urinary tract infection with 2 different antibiotics.  She states that the pain has continued to increase.  She reports having with urination gets a pain is worse admit.SABRA  History of lupus.  Denies fever, chills, nausea vomiting or diarrhea.  Denies chest pain or shortness of breath.    Below pertinent information reviewed with patient:  Past Medical History:   Diagnosis Date    Anesthesia complication     Nausea    Dyslipidemia     Essential hypertension     Hiatal hernia     History of heart murmur in childhood     Hypothyroidism     Morphea     Rheumatoid arthritis (CMS HCC)     Systemic lupus erythematosus (CMS HCC)      Allergies[1]    Past Surgical History:   Procedure Laterality Date    HX APPENDECTOMY      HX COLONOSCOPY      HX TONSILLECTOMY      LAPAROSCOPIC CHOLECYSTECTOMY          Social History     Socioeconomic History    Marital status: Married   Tobacco Use    Smoking status: Never    Smokeless tobacco: Never   Substance and Sexual Activity    Alcohol use: Never    Drug use: Never       ROS:  No other overt positive review of systems are noted other than stated in the HPI.      Objective:    ED Triage Vitals [12/17/23 1508]   BP (Non-Invasive) 128/82   Heart Rate 80   Respiratory Rate 20   Temperature 36.6 C (97.9 F)   SpO2 98 %   Weight 90.7 kg (200 lb)   Height 1.676 m (5' 6)     Filed Vitals:    12/17/23 1508 12/17/23 1530   BP: 128/82 134/88   Pulse: 80    Resp: 20    Temp: 36.6 C (97.9 F)    SpO2: 98%        Nursing notes and vital  signs reviewed.    Constitutional - No acute distress.  Alert and Active.  HEENT - Normocephalic. Atraumatic. PERRL. EOMI. Conjunctiva clear.  Moist mucous membranes.   Neck - Trachea midline. No stridor. No hoarseness.  Cardiac - Regular rate and rhythm. No murmurs, rubs, or gallops. Intact distal pulses.  Respiratory/Chest - Normal respiratory effort. Clear to auscultation bilaterally. No rales, wheezes or rhonchi. No chest tenderness.  Abdomen - Normal bowel sounds. Non-tender, soft, non-distended. No rebound or guarding.  No CVA tenderness  Musculoskeletal - Good AROM. No muscle or joint tenderness appreciated.  Paraspinal muscle tenderness in the lumbar region.  Skin - Warm and dry, without any rashes or other lesions.  Neuro - Alert and oriented x 3. Cranial nerves II-XII are grossly intact.  Moving all extremities symmetrically. Normal  gait.  Psych - Normal mood and affect. Behavior is normal          Any pertinent labs and imaging obtained during this encounter reviewed below in MDM.    MDM/ED Course:    Medical Decision Making  Patient presents to the ER with back pain.  Patient states that she has had lower back pain for a proximally 2 weeks.  She states that she has been treated for urinary tract infection with 2 different antibiotics.  She states that the pain has continued to increase.  She reports having with urination gets a pain is worse admit.SABRA  History of lupus.  Denies fever, chills, nausea vomiting or diarrhea.  Denies chest pain or shortness of breath.    Differential diagnoses include:  Urinary tract infection, pyelonephritis, nephrolithiasis, ureterolithiasis musculoskeletal pain    Patient underwent diagnostics with results as noted in ED course.    Patient was found/suspected to have lumbar pain.  Urinalysis showed 75 leukocytes with 6 white blood cells, CBC and CMP were vastly unremarkable.  Lipase was mildly elevated.  Extra KUB was negative for constipation.  CT abdomen and pelvis was  negative for any obstructing stone or pyelonephritis.  Patient will be treated for musculoskeletal pain.  Patient was given Toradol , Decadron , Robaxin .  We will await the urine culture due to patient being on 2 antibiotics previously.  Patient will be discharged with Robaxin  and lidocaine patches.  Encouraged close follow up with primary care doctor to ensure improvement in symptoms.  Patient agreeable to treatment course.  All questions answered.  Strict ER return precautions given.    Patient will be discharged in stable condition at this time.    Amount and/or Complexity of Data Reviewed  Radiology:  Decision-making details documented in ED Course.  ECG/medicine tests: independent interpretation performed.    Risk  Prescription drug management.            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     Bowel gas pattern is remarkable for mild constipation.  No evidence of bowel obstruction.  No free air.     No suspicious calcifications.     Mild degenerative changes of the spine.        IMPRESSION:  NEGATIVE TWO VIEWS OF THE ABDOMEN.     1549 CT ABDOMEN PELVIS WO IV CONTRAST  Noncontrast technique limits evaluation of the abdominal and pelvic viscera.     Lung bases: Clear     Liver:   Unremarkable.     Gallbladder:   Surgically absent.     Spleen:   Unremarkable.     Pancreas:   Unremarkable.     Adrenals:   Unremarkable.     Kidneys:   Unremarkable.     Bladder:  Unremarkable.     Uterus and Adnexa:  Prior hysterectomy.  Adnexal regions are unremarkable.     Bowel:   Unremarkable.     Appendix:  Normal.     Lymph nodes:  No suspicious lymph node  enlargement.     Vasculature:   Mild diffuse atherosclerotic  calcifications are noted.      Peritoneum / Retroperitoneum: No ascites.  No free air.     Bones:   Degenerative changes of the spine.           IMPRESSION:  NO ACUTE FINDINGS AT THE ABDOMEN OR PELVIS ON NONCONTRAST CT.         Orders Placed This Encounter    URINE CULTURE,ROUTINE    XR KUB AND UPRIGHT ABDOMEN    CANCELED: CT LUMBAR SPINE WO IV CONTRAST    CT ABDOMEN PELVIS WO IV CONTRAST    URINALYSIS, MACROSCOPIC AND MICROSCOPIC W/CULTURE REFLEX [PRN ONLY]    URINALYSIS, MACROSCOPIC    URINALYSIS, MICROSCOPIC    CBC/DIFF    COMPREHENSIVE METABOLIC PANEL, NON-FASTING    LIPASE    CBC WITH DIFF    EXTRA TUBES    BLUE TOP TUBE    GOLD TOP TUBE    GRAY TOP TUBE    dexAMETHasone  (PF) 10 mg/mL injection    ketorolac  (TORADOL ) 30 mg/mL injection    methocarbamol  (ROBAXIN ) tablet    methocarbamoL  (ROBAXIN ) 500 mg Oral Tablet         Impression:   Clinical Impression   Lumbar pain (Primary)   Elevated lipase       Disposition: Discharged    / M. Sueanne Benders, APRN, FNP-C 12/17/2023, 16:52  [facility]  Department of Emergency Medicine  [place]    Portions of this note may have been dictated using voice recognition software.     -----------------------  Results for orders placed or performed during the hospital encounter of 12/17/23 (from the past 12 hours)   URINALYSIS, MACROSCOPIC   Result Value Ref Range    COLOR Light Yellow Colorless, Light Yellow, Yellow    APPEARANCE Clear Clear    SPECIFIC GRAVITY 1.017 1.002 - 1.030    PH 7.0 5.0 - 9.0    LEUKOCYTES 75 (A) Negative, 100  WBCs/uL    NITRITE Negative Negative    PROTEIN Negative Negative, 10 , 20  mg/dL    GLUCOSE Negative Negative, 30  mg/dL    KETONES Negative Negative, Trace mg/dL    BILIRUBIN Negative Negative, 0.5 mg/dL    BLOOD 9.96 Negative, 0.03 mg/dL    UROBILINOGEN Normal Normal mg/dL   URINALYSIS, MICROSCOPIC   Result Value Ref Range    MUCOUS Rare Rare,  Occasional, Few /hpf    RBCS 3 <4 /hpf    WBCS 6 (H) <6 /hpf    SQUAMOUS EPITHELIAL 2 <28 /hpf    NON-SQUAMOUS EPITHELIAL CELLS URINE <1 <=1 /hpf   COMPREHENSIVE METABOLIC PANEL, NON-FASTING   Result Value Ref Range    SODIUM 143 136 - 145 mmol/L    POTASSIUM 4.2 3.5 - 5.1 mmol/L    CHLORIDE 110 (H) 98 - 107 mmol/L    CO2 TOTAL 25 21 - 31 mmol/L    ANION GAP 8 4 - 13 mmol/L    BUN 12 7 - 25 mg/dL    CREATININE 8.80 9.39 - 1.30 mg/dL    BUN/CREA RATIO 10 6 - 22    ESTIMATED GFR 52 (L) >59 mL/min/1.24m^2    ALBUMIN 4.3 3.5 - 5.7 g/dL    CALCIUM 9.6 8.6 - 89.6 mg/dL    GLUCOSE 95 74 - 890 mg/dL    ALKALINE PHOSPHATASE 69 34 - 104 U/L    ALT (SGPT) 20 7 - 52 U/L    AST (SGOT) 23 13 - 39 U/L  BILIRUBIN TOTAL 0.5 0.3 - 1.0 mg/dL    PROTEIN TOTAL 7.2 6.4 - 8.9 g/dL    ALBUMIN/GLOBULIN RATIO 1.5 (H) 0.8 - 1.4    OSMOLALITY, CALCULATED 285 270 - 290 mOsm/kg    CALCIUM, CORRECTED 9.4 8.9 - 10.8 mg/dL    GLOBULIN 2.9 2.0 - 3.5   LIPASE   Result Value Ref Range    LIPASE 148 (H) 11 - 82 U/L   CBC WITH DIFF   Result Value Ref Range    WBC 4.9 3.8 - 11.8 x10^3/uL    RBC 4.76 3.63 - 4.92 x10^6/uL    HGB 13.0 10.9 - 14.3 g/dL    HCT 61.2 68.7 - 58.0 %    MCV 81.3 75.5 - 95.3 fL    MCH 27.3 24.7 - 32.8 pg    MCHC 33.6 32.3 - 35.6 g/dL    RDW 82.7 87.6 - 82.2 %    PLATELETS 221 140 - 440 x10^3/uL    MPV 8.8 7.9 - 10.8 fL    NEUTROPHIL % 54 43 - 77 %    LYMPHOCYTE % 31 16 - 46 %    MONOCYTE % 9 4 - 11 %    EOSINOPHIL % 5 1 - 7 %    BASOPHIL % 1 0 - 1 %    NEUTROPHIL # 2.70 1.90 - 8.20 x10^3/uL    LYMPHOCYTE # 1.50 1.10 - 3.10 x10^3/uL    MONOCYTE # 0.40 0.20 - 0.90 x10^3/uL    EOSINOPHIL # 0.30 0.00 - 0.50 x10^3/uL    BASOPHIL # 0.00 0.00 - 0.10 x10^3/uL     XR KUB AND UPRIGHT ABDOMEN   Final Result   NEGATIVE TWO VIEWS OF THE ABDOMEN.            Radiologist location ID: [id number]         CT ABDOMEN PELVIS WO IV CONTRAST   Final Result   NO ACUTE FINDINGS AT THE ABDOMEN OR PELVIS ON NONCONTRAST CT.         Radiologist location  ID: [id number]                  [1] No Known Allergies

## 2023-12-17 NOTE — ED Triage Notes (Signed)
 Lower back pain x 2 weeks  was tx for UTI . Then had constipation . States has been to several places cont to have pain to back and burning with urination

## 2023-12-17 NOTE — Discharge Instructions (Signed)
 Take Robaxin  as needed for discomfort.  Please follow up with the family doctor to have a repeat lipase and to ensure improvement of symptoms.  Return to the ER for any new or worsening symptoms.

## 2023-12-18 ENCOUNTER — Ambulatory Visit (HOSPITAL_COMMUNITY): Payer: Self-pay

## 2023-12-19 ENCOUNTER — Ambulatory Visit (HOSPITAL_COMMUNITY): Payer: Self-pay | Admitting: Physician Assistant

## 2023-12-19 LAB — URINE CULTURE,ROUTINE: URINE CULTURE: 10000 — AB

## 2023-12-19 MED ORDER — CEPHALEXIN 500 MG CAPSULE
500.0000 mg | ORAL_CAPSULE | Freq: Three times a day (TID) | ORAL | 0 refills | Status: AC
Start: 2023-12-19 — End: 2023-12-29

## 2023-12-22 ENCOUNTER — Ambulatory Visit
Admission: RE | Admit: 2023-12-22 | Discharge: 2023-12-22 | Disposition: A | Discharge status: Home / Self Care | Source: Ambulatory Visit | Attending: Internal Medicine | Admitting: Internal Medicine

## 2023-12-22 ENCOUNTER — Ambulatory Visit (HOSPITAL_COMMUNITY)
Admission: RE | Admit: 2023-12-22 | Discharge: 2023-12-22 | Disposition: A | Discharge status: Home / Self Care | Source: Ambulatory Visit | Attending: Internal Medicine

## 2023-12-22 ENCOUNTER — Other Ambulatory Visit: Payer: Self-pay

## 2023-12-22 DIAGNOSIS — E785 Hyperlipidemia, unspecified: Secondary | ICD-10-CM | POA: Insufficient documentation

## 2023-12-22 DIAGNOSIS — R079 Chest pain, unspecified: Secondary | ICD-10-CM | POA: Insufficient documentation

## 2023-12-22 DIAGNOSIS — I10 Essential (primary) hypertension: Secondary | ICD-10-CM | POA: Insufficient documentation

## 2023-12-22 DIAGNOSIS — R0609 Other forms of dyspnea: Secondary | ICD-10-CM | POA: Insufficient documentation

## 2023-12-22 DIAGNOSIS — Z8249 Family history of ischemic heart disease and other diseases of the circulatory system: Secondary | ICD-10-CM | POA: Insufficient documentation

## 2023-12-22 MED ORDER — REGADENOSON 0.4 MG/5 ML INTRAVENOUS SYRINGE
0.4000 mg | INJECTION | Freq: Once | INTRAVENOUS | Status: AC
  Administered 2023-12-22: 0.4 mg via INTRAVENOUS
  Filled 2023-12-22: qty 5

## 2023-12-23 ENCOUNTER — Ambulatory Visit (HOSPITAL_COMMUNITY)
Admission: RE | Admit: 2023-12-23 | Discharge: 2023-12-23 | Discharge status: Home / Self Care | Payer: Self-pay | Source: Ambulatory Visit | Attending: Internal Medicine

## 2023-12-23 ENCOUNTER — Ambulatory Visit
Admission: RE | Admit: 2023-12-23 | Discharge: 2023-12-23 | Disposition: A | Discharge status: Home / Self Care | Payer: Self-pay | Source: Ambulatory Visit | Attending: Internal Medicine | Admitting: Internal Medicine

## 2024-02-03 ENCOUNTER — Telehealth (INDEPENDENT_AMBULATORY_CARE_PROVIDER_SITE_OTHER): Payer: Self-pay | Admitting: INTERVENTIONAL CARDIOLOGY

## 2024-02-03 NOTE — Telephone Encounter (Signed)
 Pt called and scheduled LHC on Sept 25 at [facility]. Cath lab will call day before to let her know what time to be there, also needs a driver. Pt voiced understanding of all above info.

## 2024-02-18 ENCOUNTER — Ambulatory Visit
Admit: 2024-02-18 | Discharge: 2024-02-18 | Disposition: A | Discharge status: Home / Self Care | Source: Ambulatory Visit | Attending: INTERVENTIONAL CARDIOLOGY | Admitting: INTERVENTIONAL CARDIOLOGY

## 2024-02-18 ENCOUNTER — Encounter (HOSPITAL_COMMUNITY): Payer: Self-pay | Admitting: INTERVENTIONAL CARDIOLOGY

## 2024-02-18 ENCOUNTER — Encounter (HOSPITAL_COMMUNITY)
Disposition: A | Discharge status: Home / Self Care | Payer: Self-pay | Source: Ambulatory Visit | Attending: INTERVENTIONAL CARDIOLOGY

## 2024-02-18 DIAGNOSIS — I10 Essential (primary) hypertension: Secondary | ICD-10-CM | POA: Insufficient documentation

## 2024-02-18 DIAGNOSIS — R0602 Shortness of breath: Secondary | ICD-10-CM | POA: Insufficient documentation

## 2024-02-18 DIAGNOSIS — I2510 Atherosclerotic heart disease of native coronary artery without angina pectoris: Secondary | ICD-10-CM | POA: Insufficient documentation

## 2024-02-18 DIAGNOSIS — R9439 Abnormal result of other cardiovascular function study: Secondary | ICD-10-CM | POA: Insufficient documentation

## 2024-02-18 DIAGNOSIS — Z8249 Family history of ischemic heart disease and other diseases of the circulatory system: Secondary | ICD-10-CM | POA: Insufficient documentation

## 2024-02-18 DIAGNOSIS — R0789 Other chest pain: Secondary | ICD-10-CM | POA: Insufficient documentation

## 2024-02-18 DIAGNOSIS — Z79899 Other long term (current) drug therapy: Secondary | ICD-10-CM | POA: Insufficient documentation

## 2024-02-18 DIAGNOSIS — Z7982 Long term (current) use of aspirin: Secondary | ICD-10-CM | POA: Insufficient documentation

## 2024-02-18 DIAGNOSIS — I25119 Atherosclerotic heart disease of native coronary artery with unspecified angina pectoris: Secondary | ICD-10-CM

## 2024-02-18 DIAGNOSIS — E785 Hyperlipidemia, unspecified: Secondary | ICD-10-CM | POA: Insufficient documentation

## 2024-02-18 DIAGNOSIS — I25118 Atherosclerotic heart disease of native coronary artery with other forms of angina pectoris: Secondary | ICD-10-CM | POA: Diagnosis not present

## 2024-02-18 LAB — CBC WITH DIFF
BASOPHIL #: 0.1 x10ˆ3/uL (ref 0.00–0.10)
BASOPHIL %: 1 % (ref 0–1)
EOSINOPHIL #: 0.2 x10ˆ3/uL (ref 0.00–0.50)
EOSINOPHIL %: 4 % (ref 1–7)
HCT: 38.4 % (ref 31.2–41.9)
HGB: 13.3 g/dL (ref 10.9–14.3)
LYMPHOCYTE #: 1 x10ˆ3/uL — ABNORMAL LOW (ref 1.10–3.10)
LYMPHOCYTE %: 26 % (ref 16–46)
MCH: 28.8 pg (ref 24.7–32.8)
MCHC: 34.5 g/dL (ref 32.3–35.6)
MCV: 83.4 fL (ref 75.5–95.3)
MONOCYTE #: 0.4 x10ˆ3/uL (ref 0.20–0.90)
MONOCYTE %: 9 % (ref 4–11)
MPV: 9.6 fL (ref 7.9–10.8)
NEUTROPHIL #: 2.4 x10ˆ3/uL (ref 1.90–8.20)
NEUTROPHIL %: 60 % (ref 43–77)
PLATELETS: 186 x10ˆ3/uL (ref 140–440)
RBC: 4.61 x10ˆ6/uL (ref 3.63–4.92)
RDW: 16.9 % (ref 12.3–17.7)
WBC: 4 x10ˆ3/uL (ref 3.8–11.8)

## 2024-02-18 LAB — PTT (PARTIAL THROMBOPLASTIN TIME): APTT: 30.9 s (ref 25.0–38.0)

## 2024-02-18 LAB — BASIC METABOLIC PANEL
ANION GAP: 8 mmol/L (ref 4–13)
BUN/CREA RATIO: 12 (ref 6–22)
BUN: 14 mg/dL (ref 7–25)
CALCIUM: 9.4 mg/dL (ref 8.6–10.3)
CHLORIDE: 110 mmol/L — ABNORMAL HIGH (ref 98–107)
CO2 TOTAL: 26 mmol/L (ref 21–31)
CREATININE: 1.16 mg/dL (ref 0.60–1.30)
ESTIMATED GFR: 54 mL/min/1.73mˆ2 — ABNORMAL LOW (ref >59)
GLUCOSE: 120 mg/dL — ABNORMAL HIGH (ref 74–109)
OSMOLALITY, CALCULATED: 289 mosm/kg (ref 270–290)
POTASSIUM: 3.7 mmol/L (ref 3.5–5.1)
SODIUM: 144 mmol/L (ref 136–145)

## 2024-02-18 LAB — MAGNESIUM: MAGNESIUM: 2.1 mg/dL (ref 1.9–2.7)

## 2024-02-18 LAB — PT/INR
INR: 1.03 (ref 0.84–1.10)
PROTHROMBIN TIME: 11.6 s (ref 9.8–12.7)

## 2024-02-18 LAB — INVASIVE CARDIOLOGY PROCEDURE: Cath EF Quantitative: 60 %

## 2024-02-18 SURGERY — CORONARY ANGIOGRAPHY W/LEFT HEART CATH W/WO LVG
Anesthesia: IV Sedation (Nurse Monitored)

## 2024-02-18 MED ORDER — NITROGLYCERIN 100 MCG/ML IN NS INJECTION
INJECTION | Freq: Once | INTRAVENOUS | Status: DC | PRN
Start: 2024-02-18 — End: 2024-02-18
  Administered 2024-02-18: 200 ug via INTRA_ARTERIAL

## 2024-02-18 MED ORDER — AMLODIPINE 2.5 MG TABLET
2.5000 mg | ORAL_TABLET | Freq: Every day | ORAL | 3 refills | Status: AC
Start: 2024-02-18 — End: ?

## 2024-02-18 MED ORDER — ACETAMINOPHEN 325 MG TABLET
975.0000 mg | ORAL_TABLET | Freq: Once | ORAL | Status: DC | PRN
Start: 2024-02-18 — End: 2024-02-18

## 2024-02-18 MED ORDER — MIDAZOLAM 5 MG/ML INJECTION WRAPPER
INTRAMUSCULAR | Status: AC
Start: 2024-02-18 — End: 2024-02-18
  Filled 2024-02-18: qty 1

## 2024-02-18 MED ORDER — LIDOCAINE HCL 10 MG/ML (1 %) INJECTION SOLUTION
INTRAMUSCULAR | Status: AC
Start: 2024-02-18 — End: 2024-02-18
  Filled 2024-02-18: qty 50

## 2024-02-18 MED ORDER — LIDOCAINE HCL 10 MG/ML (1 %) INJECTION SOLUTION
Freq: Once | INTRAMUSCULAR | Status: DC | PRN
Start: 2024-02-18 — End: 2024-02-18
  Administered 2024-02-18: 2 mL via INTRADERMAL

## 2024-02-18 MED ORDER — HEPARIN (PORCINE) (PF) 2,000 UNIT/1,000 ML IN 0.9 % SODIUM CHLORIDE IV
INTRAVENOUS | Status: AC
Start: 2024-02-18 — End: 2024-02-18
  Filled 2024-02-18: qty 2000

## 2024-02-18 MED ORDER — MIDAZOLAM 5 MG/ML INJECTION WRAPPER
Freq: Once | INTRAMUSCULAR | Status: DC | PRN
Start: 2024-02-18 — End: 2024-02-18
  Administered 2024-02-18 (×3): 1 mg via INTRAVENOUS

## 2024-02-18 MED ORDER — VERAPAMIL 2.5 MG/ML INTRAVENOUS SOLUTION
INTRAVENOUS | Status: AC
Start: 2024-02-18 — End: 2024-02-18
  Filled 2024-02-18: qty 2

## 2024-02-18 MED ORDER — NITROGLYCERIN 100 MCG/ML IN NS INJECTION
INJECTION | INTRAVENOUS | Status: AC
Start: 2024-02-18 — End: 2024-02-18
  Filled 2024-02-18: qty 50

## 2024-02-18 MED ORDER — DIAZEPAM 5 MG TABLET
ORAL_TABLET | ORAL | Status: AC
Start: 2024-02-18 — End: 2024-02-18
  Filled 2024-02-18: qty 1

## 2024-02-18 MED ORDER — HEPARIN (PORCINE) 1,000 UNIT/ML INJECTION SOLUTION
Freq: Once | INTRAMUSCULAR | Status: DC | PRN
Start: 2024-02-18 — End: 2024-02-18
  Administered 2024-02-18: 5000 [IU] via INTRAVENOUS

## 2024-02-18 MED ORDER — SODIUM CHLORIDE 0.9 % (FLUSH) INJECTION SYRINGE
3.0000 mL | INJECTION | Freq: Three times a day (TID) | INTRAMUSCULAR | Status: DC
Start: 2024-02-18 — End: 2024-02-18

## 2024-02-18 MED ORDER — SODIUM CHLORIDE 0.9% FLUSH BAG - 250 ML
INTRAVENOUS | Status: DC | PRN
Start: 2024-02-18 — End: 2024-02-18

## 2024-02-18 MED ORDER — BIVALIRUDIN 250 MG INTRAVENOUS POWDER FOR SOLUTION
INTRAVENOUS | Status: AC
Start: 2024-02-18 — End: 2024-02-18
  Filled 2024-02-18: qty 5

## 2024-02-18 MED ORDER — SODIUM CHLORIDE 0.9 % (FLUSH) INJECTION SYRINGE
3.0000 mL | INJECTION | INTRAMUSCULAR | Status: DC | PRN
Start: 2024-02-18 — End: 2024-02-18

## 2024-02-18 MED ORDER — ASPIRIN 81 MG CHEWABLE TABLET
324.0000 mg | CHEWABLE_TABLET | Freq: Once | ORAL | Status: AC
Start: 2024-02-18 — End: 2024-02-18
  Administered 2024-02-18: 324 mg via ORAL

## 2024-02-18 MED ORDER — IOHEXOL 350 MG IODINE/ML INTRAVENOUS SOLUTION
Freq: Once | INTRAVENOUS | Status: DC | PRN
Start: 2024-02-18 — End: 2024-02-18
  Administered 2024-02-18: 100 mL via INTRAVENOUS

## 2024-02-18 MED ORDER — ASPIRIN 81 MG CHEWABLE TABLET
CHEWABLE_TABLET | ORAL | Status: AC
Start: 2024-02-18 — End: 2024-02-18
  Filled 2024-02-18: qty 4

## 2024-02-18 MED ORDER — VERAPAMIL 2.5 MG/ML INTRAVENOUS SOLUTION
Freq: Once | INTRAVENOUS | Status: DC | PRN
Start: 2024-02-18 — End: 2024-02-18
  Administered 2024-02-18: 5 mg via INTRA_ARTERIAL

## 2024-02-18 MED ORDER — ROSUVASTATIN 20 MG TABLET
20.0000 mg | ORAL_TABLET | Freq: Every evening | ORAL | 3 refills | Status: AC
Start: 2024-02-18 — End: ?

## 2024-02-18 MED ORDER — DEXTROSE 5% IN WATER (D5W) FLUSH BAG - 250 ML
INTRAVENOUS | Status: DC | PRN
Start: 2024-02-18 — End: 2024-02-18

## 2024-02-18 MED ORDER — FENTANYL (PF) 50 MCG/ML INJECTION WRAPPER
INJECTION | Freq: Once | INTRAMUSCULAR | Status: DC | PRN
Start: 2024-02-18 — End: 2024-02-18
  Administered 2024-02-18 (×3): 25 ug via INTRAVENOUS

## 2024-02-18 MED ORDER — DIPHENHYDRAMINE 25 MG CAPSULE
25.0000 mg | ORAL_CAPSULE | ORAL | Status: AC
Start: 2024-02-18 — End: 2024-02-18
  Administered 2024-02-18: 25 mg via ORAL

## 2024-02-18 MED ORDER — DIPHENHYDRAMINE 25 MG CAPSULE
ORAL_CAPSULE | ORAL | Status: AC
Start: 2024-02-18 — End: 2024-02-18
  Filled 2024-02-18: qty 1

## 2024-02-18 MED ORDER — FENTANYL (PF) 50 MCG/ML INJECTION SOLUTION
INTRAMUSCULAR | Status: AC
Start: 2024-02-18 — End: 2024-02-18
  Filled 2024-02-18: qty 2

## 2024-02-18 MED ORDER — HEPARIN (PORCINE) 1,000 UNIT/ML INJECTION SOLUTION
INTRAMUSCULAR | Status: AC
Start: 2024-02-18 — End: 2024-02-18
  Filled 2024-02-18: qty 10

## 2024-02-18 MED ORDER — SODIUM CHLORIDE 0.9 % INTRAVENOUS SOLUTION
INTRAVENOUS | Status: DC
Start: 2024-02-18 — End: 2024-02-18
  Administered 2024-02-18: 0 mL via INTRAVENOUS

## 2024-02-18 MED ORDER — SODIUM CHLORIDE 0.9 % INTRAVENOUS SOLUTION
INTRAVENOUS | Status: AC | PRN
Start: 2024-02-18 — End: 2024-02-18
  Administered 2024-02-18: 75 mL/h via INTRAVENOUS

## 2024-02-18 MED ORDER — DIAZEPAM 5 MG TABLET
5.0000 mg | ORAL_TABLET | ORAL | Status: AC
Start: 2024-02-18 — End: 2024-02-18
  Administered 2024-02-18: 5 mg via ORAL

## 2024-02-18 SURGICAL SUPPLY — 15 items
CATH ANGIO 5FR PGTL ST CURVE 110CM PERFORMA MIV 6 SH RADOPQ BRD RADIAL PEBAX PLYCRB NYL STRL LF (CATHETERS) ×2 IMPLANT
CATH ANGIO 5FR RADIAL TIG 4 CURVE 100CM OPTITORQUE LRG LUM SH 2 BRD SFT TIP COR SS NYL POLYUR (CATHETERS) ×2 IMPLANT
CATH DIAG DXTERITY 5FR .047IN 100CM JDKN RGT 5 CURVE LRG LUM RADOPQ 2 BRD WRE CLR CD HUB INSLIDE (VASCULAR) ×2 IMPLANT
CATH GD 5FR 100CM EBU3.5 CURVE LNCHR LRG LUM RADOPQ COR NYL STRL LF (CATHETERS) ×2 IMPLANT
COVER PROBE 58X5.5IN TELESCOPICAL FOLD ELAS BAND GEL PKT STRL LF  CIV-FLX (MED SURG SUPPLIES) ×2 IMPLANT
DCNTR FLUID DISPENSR BAG BAJ DISP STRL LF  ASPT TRANSF (IV TUBING & ACCESSORIES) ×2 IMPLANT
DEVICE COMPRESS TR BAND 24CM 2 BAL TRNSPR ADJ STRAP REG RADIAL ART (MED SURG SUPPLIES) ×2 IMPLANT
GLOVE SURG 8 LF  PF BEAD CUF STRL CRM 11.8IN PROTEXIS PI PLISPRN THK9.1 MIL (GLOVES AND ACCESSORIES) ×4 IMPLANT
GW EMRLD .035IN 7CM 260CM FLXB END FIX COR EXCH PTFE VAS PERC 3MM RAD J CURVE DISP (WIRE) ×2 IMPLANT
GW GLDWR .035IN 3CM 150CM FLXB ANG TIP RADOPQ KINK RST NITINOL TUNG POLYUR HDRPH VAS STD (WIRE) ×2 IMPLANT
KIT INTROD 10CM 6FR 22GA GLIDESHEATH SLNDR .021IN PLASTIC SHEATH DIL 2 WL PNCT SHORT ANG MINIWIRE 45 (INTRODUCER) ×2 IMPLANT
SCATPAD RAD SHIELD 145 X 165 (MED SURG SUPPLIES) ×2 IMPLANT
SET ADMN MERIT MEDICAL SYS INJ MED CNRST S DISP (VASCULAR) ×2 IMPLANT
TUBING PROCEDURE 72IN RADGR STRL DISP (MED SURG SUPPLIES) ×2 IMPLANT
VALVE HMSTS WATCHDOG KIT (CARDIAC) ×2 IMPLANT

## 2024-02-18 NOTE — H&P (Signed)
 [facility]    Cardiology Consult                                                                            [place], [place], 58 y.o. female  Date of Admission:  02/18/2024  Date of Birth:  1966/04/27    02/18/2024    STOP: IF H&P IS GREATER THAN 30 DAYS FROM SURGICAL DAY COMPLETE NEW H&P IS REQUIRED.     H & P updated the day of the procedure.  1.  H&P completed within 30 days of surgical procedure and has been reviewed within 24 hours of admission but prior to surgery or a procedure requiring anesthesia services, the patient has been examined, and no change has occured in the patients condition since the H&P was completed.       Change in medications: No              Comments: see full H&P scanned in media    2.  Patient continues to be appropriate candidate for planned cardiac procedure. YES    3. HPI: The patient follows with Dr. CANDIE Galloway.  She c/o chest heaviness relieved with NTG and sob with exertion.  Stress test performed and showed a minimal perfusion defect of the anterior and inferior wall.  She was treated medically initially, but continued to have episodes of chest pain so was recommended for left heart catheterization.    Leandrew JINNY Batty, APRN,FNP-BC

## 2024-02-18 NOTE — Discharge Instructions (Addendum)
 Follow up with Dr Tamela Galloway- Thursday, Nov. 13, 2025 at 4pm.    Increase Crestor from 10 mg daily to 20 mg daily.    Stop taking Isosorbide and Lisinopril     Start taking Norvasc 2.5mg  daily

## 2024-02-18 NOTE — Sedation Documentation (Signed)
 02/18/24  Procedure(s):  CORONARY ANGIOGRAPHY W/LEFT HEART CATH W/WO LVG  CARD US  GUIDED ACCESS    Diagnosis:     Sedation Informed Consent, pre-sedation risk assessment and evaluation completed.  History of previous adverse experiences with sedation/analgesia/anesthesia assessed.  Monitored conscious sedation was administered under my direct supervision by an appropriately trained sedation nurse.  Appropriate Facility and Equipment compliant.      Procedure time out  Timeouts       Beverley Leader, RN at [facility] Feb 18, 2024 1239 EDT       Timeout Details       Timeout type: Preprocedure              Procedures       Panel 1: CORONARY ANGIOGRAPHY W/LEFT HEART CATH W/WO LVG with Rochanda Harpham, Garnette, MD    Panel 1: CARD US  GUIDED ACCESS with Wasil Wolke, Garnette, MD              Timeout Questions    Correct patient? Yes  Correct site? Yes  Correct side? N/A  Correct position? Yes  Correct procedure? Yes  Site marked? N/A  H&P note completed? Yes  Consents verified? Yes  Radiology studies available? Yes  Relevant lab results available? Yes  Allergies reviewed? Yes  Are all required blood products & devices for the procedure available? Yes  Is documentation verified? Yes             Staff Present       Physicians  Lathan Gieselman, Garnette, MD Staff  Abran Dorothyann SQUIBB, RN  Drusilla Sharper, RT SHERRELL)  Beverley Leader, RN              Signing History       Staff Performed Signed    Beverley Leader, RN Thu Feb 18, 2024 1239 EDT Thu Feb 18, 2024 1239 EDT                            Physician in  and out times  Physicians       Name Panel Role Time Period    Bertine Schlottman, Garnette, MD Panel 1 Primary 02/18/2024 1200 - 02/18/2024 1312          Sedation Staff       Name Type Time Period    Abran Dorothyann SQUIBB, RN Invasive Nurse 02/18/2024 1200 - 02/18/2024 1312            Sedation and Procedure Times:  Event Time In   CV Sedation Start 1211   CV Sedation Stop 1304          Proc Name Event Type Event Time    CORONARY ANGIOGRAPHY W/LEFT HEART CATH W/WO LVG  Incision Start Thu  Feb 18, 2024 12:40 PM    CORONARY ANGIOGRAPHY W/LEFT HEART CATH W/WO LVG  Incision Close Thu Feb 18, 2024  1:04 PM    CARD US  GUIDED ACCESS  Incision Start Thu Feb 18, 2024 12:40 PM    CARD US  GUIDED ACCESS  Incision Close Thu Feb 18, 2024  1:04 PM            Aldrete Scores    Pre Sedation  Activity: 2-->able to move 4 extremities voluntarily or on command  Respiration: 2-->able to breathe and cough freely  Circulation: 2-->BP within 20% of pre-anesthetic level  Consciousness: 2-->fully awake  O2 Saturation: 2-->able to maintain O2 saturation greater than 92% on room air  Dressing: 2-->dry and clean  or not applicable     Ambulation: 2-->able to stand up and walk straight, on ordered bedrest, or performing at previous level of functioning  Fasting/Feeding: 2-->able to drink fluids or NPO, minimal nausea/ no vomiting  Urine Output: 2-->has voided, adequate urine output per device, or not applicable         Post Sedation  Assessment Scored:: Post-Procedure  Activity: 2-->able to move 4 extremities voluntarily or on command  Respiration: 2-->able to breathe and cough freely  Circulation: 2-->BP within 20% of pre-anesthetic level  Consciousness: 2-->fully awake  O2 Saturation: 2-->able to maintain O2 saturation greater than 92% on room air  Dressing: 2-->dry and clean or not applicable  Pain: 2-->pain free  Ambulation: 2-->able to stand up and walk straight, on ordered bedrest, or performing at previous level of functioning  Urine Output: 2-->has voided, adequate urine output per device, or not applicable  Post Modified Aldrete Score: 20            Medications (moderate): Fentanyl , Versed      Unit: [facility] Invasive Lab  IV Type: Peripheral IV        Effects of Administration: Successful sedation w/o adverse events       Patient was continuously monitored throughout the procedure.  Provider was in attendance throughout sedation.  See Invasive Procedure Log for additional details.    Garnette Sink, MD

## 2024-02-18 NOTE — Brief Op Note (Signed)
 The patient was brought to the cardiac catheterization lab and prepped and draped in the usual fashion.    Lidocaine  was administered to the right radial site.   Using real-time U/S guidance, patency confirmed and with true Seldinger technique, a 6 French slender sheath was placed in the right radial artery.    Standard catheters were used to engage and define the right and left coronary arteries.    A catheter was advanced to the left ventricle.    Left ventriculogram was performed by hand injection.    Pressure measurement was recorded within the left ventricle and upon removal across the aortic valve.   The catheter and sheath were removed and hemostasis achieved with a transradial band.    No complications  Minimal blood loss  No specimen

## 2024-02-19 DIAGNOSIS — R9431 Abnormal electrocardiogram [ECG] [EKG]: Secondary | ICD-10-CM

## 2024-02-19 LAB — ECG 12 LEAD
Atrial Rate: 64 {beats}/min
Calculated P Axis: 21 degrees
Calculated R Axis: -21 degrees
Calculated T Axis: 20 degrees
PR Interval: 180 ms
QRS Duration: 92 ms
QT Interval: 442 ms
QTC Calculation: 455 ms
Ventricular rate: 64 {beats}/min
